# Patient Record
Sex: Male | Born: 1965 | Race: White | Hispanic: No | Smoking: Never smoker
Health system: Southern US, Community
[De-identification: ages and names within clinical notes are randomized; demographics above are authoritative.]

## PROBLEM LIST (undated history)

## (undated) DIAGNOSIS — I10 Essential (primary) hypertension: Secondary | ICD-10-CM

## (undated) DIAGNOSIS — T7840XA Allergy, unspecified, initial encounter: Secondary | ICD-10-CM

## (undated) DIAGNOSIS — K219 Gastro-esophageal reflux disease without esophagitis: Secondary | ICD-10-CM

## (undated) HISTORY — DX: Gastro-esophageal reflux disease without esophagitis: K21.9

## (undated) HISTORY — DX: Essential (primary) hypertension: I10

## (undated) HISTORY — DX: Allergy, unspecified, initial encounter: T78.40XA

---

## 2014-02-18 LAB — BASIC METABOLIC PANEL
BUN: 18 mg/dL (ref 4–21)
Creatinine: 1.3 mg/dL (ref 0.6–1.3)
GLUCOSE: 90 mg/dL
POTASSIUM: 47 mmol/L — AB (ref 3.4–5.3)
Potassium: 4.7 mmol/L (ref 3.4–5.3)
SODIUM: 136 mmol/L — AB (ref 137–147)

## 2014-02-18 LAB — LIPID PANEL
Cholesterol: 182 mg/dL (ref 0–200)
HDL: 72 mg/dL — AB (ref 35–70)
LDL Cholesterol: 101 mg/dL
LDl/HDL Ratio: 1.4
TRIGLYCERIDES: 44 mg/dL (ref 40–160)

## 2014-02-18 LAB — CBC AND DIFFERENTIAL
HCT: 48 % (ref 41–53)
Hemoglobin: 16.5 g/dL (ref 13.5–17.5)
Platelets: 207 10*3/uL (ref 150–399)
WBC: 4.3 10*3/mL

## 2014-02-18 LAB — HEPATIC FUNCTION PANEL
ALT: 18 U/L (ref 10–40)
AST: 26 U/L (ref 14–40)

## 2014-02-18 LAB — TSH: TSH: 1.34 u[IU]/mL (ref 0.41–5.90)

## 2014-10-03 DIAGNOSIS — I861 Scrotal varices: Secondary | ICD-10-CM | POA: Insufficient documentation

## 2014-10-03 DIAGNOSIS — N50819 Testicular pain, unspecified: Secondary | ICD-10-CM | POA: Insufficient documentation

## 2014-10-03 DIAGNOSIS — L309 Dermatitis, unspecified: Secondary | ICD-10-CM | POA: Insufficient documentation

## 2014-12-16 ENCOUNTER — Encounter: Payer: Self-pay | Admitting: Family Medicine

## 2014-12-16 ENCOUNTER — Ambulatory Visit (INDEPENDENT_AMBULATORY_CARE_PROVIDER_SITE_OTHER): Payer: 59 | Admitting: Family Medicine

## 2014-12-16 VITALS — BP 112/70 | HR 68 | Temp 98.7°F | Resp 16 | Ht 70.0 in | Wt 174.0 lb

## 2014-12-16 DIAGNOSIS — Z0000 Encounter for general adult medical examination without abnormal findings: Secondary | ICD-10-CM

## 2014-12-16 DIAGNOSIS — Z131 Encounter for screening for diabetes mellitus: Secondary | ICD-10-CM

## 2014-12-16 DIAGNOSIS — Z1211 Encounter for screening for malignant neoplasm of colon: Secondary | ICD-10-CM

## 2014-12-16 DIAGNOSIS — L309 Dermatitis, unspecified: Secondary | ICD-10-CM

## 2014-12-16 DIAGNOSIS — Z8042 Family history of malignant neoplasm of prostate: Secondary | ICD-10-CM

## 2014-12-16 LAB — IFOBT (OCCULT BLOOD): IMMUNOLOGICAL FECAL OCCULT BLOOD TEST: NEGATIVE

## 2014-12-16 NOTE — Progress Notes (Signed)
Patient ID: Peter Miranda, male   DOB: December 23, 1965, 49 y.o.   MRN: [id number]        Patient: Peter Miranda, Male    DOB: [birth year], 49 y.o.   MRN: [id number] Visit Date: 12/16/2014  Today's Provider: Lorie Phenix, MD   Chief Complaint  Patient presents with  . Annual Exam   Subjective:    Annual physical exam [name] is a 49 y.o. male who presents today for health maintenance and complete physical. He feels well. He reports exercising 4 times a week. He reports he is sleeping fairly well, 6 hours .  01/19/14 CPE 01/19/14 EKG 01/29/14 PSA 0.9   Lab Results  Component Value Date   WBC 4.3 02/18/2014   HGB 16.5 02/18/2014   HCT 48 02/18/2014   PLT 207 02/18/2014   CHOL 182 02/18/2014   TRIG 44 02/18/2014   HDL 72* 02/18/2014   LDLCALC 101 02/18/2014   ALT 18 02/18/2014   AST 26 02/18/2014   NA 136* 02/18/2014   K 47.0* 02/18/2014   CREATININE 1.3 02/18/2014   BUN 18 02/18/2014   TSH 1.34 02/18/2014    -----------------------------------------------------------------   Review of Systems  Constitutional: Negative.   HENT: Negative.   Eyes: Negative.   Respiratory: Negative.   Cardiovascular: Negative.   Gastrointestinal: Negative.   Endocrine: Negative.   Genitourinary: Negative.   Musculoskeletal: Negative.   Skin: Negative.   Allergic/Immunologic: Negative.   Neurological: Negative.   Hematological: Negative.   Psychiatric/Behavioral: Negative.     Social History He  reports that he has never smoked. He has never used smokeless tobacco. He reports that he drinks about 1.8 oz of alcohol per week. He reports that he does not use illicit drugs.  Patient Active Problem List   Diagnosis Date Noted  . Dermatitis, eczematoid 10/03/2014  . Orchialgia 10/03/2014  . Scrotal varicose veins 10/03/2014    History reviewed. No pertinent past surgical history.  Family History  Family Status  Relation Status Death Age  . Mother Alive   . Father Alive     . Paternal Grandfather Deceased 60'S    PROSTATE CANCER    His family history includes Cancer in his father; Heart disease in his father; Hyperlipidemia in his mother; Hypertension in his mother; Macular degeneration in his mother; Varicose Veins in his father.    Not on File  Previous Medications   No medications on file    Patient Care Team: Lorie Phenix, MD as PCP - General (Family Medicine)     Objective:   Vitals: BP 112/70 mmHg  Pulse 68  Temp(Src) 98.7 F (37.1 C) (Oral)  Resp 16  Ht  (1.778 m)  Wt 174 lb (78.926 kg)  BMI 24.97 kg/m2  SpO2 98%   Physical Exam  Constitutional: He is oriented to person, place, and time. He appears well-developed and well-nourished.  HENT:  Head: Normocephalic and atraumatic.  Right Ear: External ear normal.  Left Ear: External ear normal.  Nose: Nose normal.  Mouth/Throat: Oropharynx is clear and moist.  Eyes: Conjunctivae and EOM are normal. Pupils are equal, round, and reactive to light.  Neck: Normal range of motion. Neck supple.  Cardiovascular: Normal rate, regular rhythm, normal heart sounds and intact distal pulses.   Pulmonary/Chest: Effort normal and breath sounds normal.  Abdominal: Soft. Bowel sounds are normal.  Genitourinary: Rectum normal and prostate normal. Guaiac negative stool.  Musculoskeletal: Normal range of motion.  Neurological: He is alert and oriented to  person, place, and time. He has normal reflexes.  Skin: Skin is warm and dry.  Psychiatric: He has a normal mood and affect. His behavior is normal. Judgment and thought content normal.     Depression Screen PHQ 2/9 Scores 12/16/2014  PHQ - 2 Score 0      Assessment & Plan:     Routine Health Maintenance and Physical Exam  Exercise Activities and Dietary recommendations Goals    . Exercise 150 minutes per week (moderate activity)        There is no immunization history on file for this patient.  Health Maintenance  Topic Date  Due  . HIV Screening  05/20/1981  . TETANUS/TDAP  05/20/1985  . INFLUENZA VACCINE  11/23/2014       1. Annual physical exam Stable. Patient advised to continue eating healthy and exercise daily. - CBC with Differential/Platelet - Lipid panel - TSH - Comprehensive metabolic panel  2. Family history of prostate cancer F/U pending lab report. - PSA  3. Dermatitis, eczematoid Treat as needed.   4. Colon cancer screening - IFOBT POC (occult bld, rslt in office)  5. Screening for diabetes mellitus F/U pending lab report. - Hemoglobin A1c      Patient seen and examined by Dr. Leo Grosser, and note scribed by Liz Beach. Dimas, CMA.  I have reviewed the document for accuracy and completeness and I agree with above. Leo Grosser, MD        --------------------------------------------------------------------

## 2015-07-16 ENCOUNTER — Telehealth: Payer: Self-pay | Admitting: Family Medicine

## 2015-07-16 MED ORDER — TRAZODONE HCL 50 MG PO TABS: 25.0000 mg | ORAL_TABLET | Freq: Every evening | ORAL | Status: DC | PRN

## 2015-07-16 NOTE — Telephone Encounter (Signed)
Is this okay? Peter Miranda, CMA  

## 2015-07-16 NOTE — Telephone Encounter (Signed)
Advised pt. Will call back to schedule appointment. Allene DillonEmily Drozdowski, CMA

## 2015-07-16 NOTE — Telephone Encounter (Signed)
Pt states he has been taking his wife's Rx Trazodone 50mg  to help him sleep.  He is not taking this every night but as needed.  Pt is requesting a Rx for this if possible without an office visit.  [employer].  ZO#[phone]/WJCB#[phone]/MW

## 2015-07-16 NOTE — Telephone Encounter (Signed)
Sent in one month supply. Does need ov in next few weeks regarding this issue.   Thanks.

## 2015-08-25 ENCOUNTER — Ambulatory Visit
Admission: RE | Admit: 2015-08-25 | Discharge: 2015-08-25 | Disposition: A | Discharge status: Home / Self Care | Payer: 59 | Source: Ambulatory Visit | Attending: Family Medicine | Admitting: Family Medicine

## 2015-08-25 ENCOUNTER — Encounter: Payer: Self-pay | Admitting: Family Medicine

## 2015-08-25 ENCOUNTER — Ambulatory Visit (INDEPENDENT_AMBULATORY_CARE_PROVIDER_SITE_OTHER): Payer: 59 | Admitting: Family Medicine

## 2015-08-25 VITALS — BP 110/60 | HR 64 | Temp 98.6°F | Resp 16 | Wt 174.0 lb

## 2015-08-25 DIAGNOSIS — G5792 Unspecified mononeuropathy of left lower limb: Secondary | ICD-10-CM | POA: Insufficient documentation

## 2015-08-25 DIAGNOSIS — G5793 Unspecified mononeuropathy of bilateral lower limbs: Secondary | ICD-10-CM | POA: Insufficient documentation

## 2015-08-25 DIAGNOSIS — G5791 Unspecified mononeuropathy of right lower limb: Secondary | ICD-10-CM | POA: Diagnosis present

## 2015-08-25 DIAGNOSIS — G4700 Insomnia, unspecified: Secondary | ICD-10-CM | POA: Insufficient documentation

## 2015-08-25 MED ORDER — TRAZODONE HCL 50 MG PO TABS: 25.0000 mg | ORAL_TABLET | Freq: Every evening | ORAL | Status: DC | PRN

## 2015-08-25 MED ORDER — MELOXICAM 15 MG PO TABS: 15.0000 mg | ORAL_TABLET | Freq: Every day | ORAL | Status: DC

## 2015-08-25 NOTE — Progress Notes (Signed)
Patient ID: Peter Miranda, male   DOB: 1965-09-06, 50 y.o.   MRN: [id number]       Patient: Peter Miranda Male    DOB: 1965-10-09   50 y.o.   MRN: [id number] Visit Date: 08/25/2015  Today's Provider: Lorie Phenix, MD   Chief Complaint  Patient presents with  . Follow-up    insomnia  . Numbness    leg   Subjective:    HPI  Insomnia  He presents today for evaluation of insomnia. Onset was several  years ago. Insomnia is getting improved. He has trouble sleeping once or twice a month.   He does not have difficulty FALLING asleep. He has difficulty STAYING asleep. He does not wake frequently to urinate. He does not have urge to move legs when resting. He is not having pain when trying to sleep  He is not having anxiety. He is having a lot of stress in his life. He is not having depression.  He is not taking stimulant medications. He is not taking new medications:  He is not taking OTC sleeping aid. He is taking medications to help sleep. He is not drinking alcohol to help sleep. He is not using illicit drugs. --------------------------------------------------------------------  Neuropathy: He describes symptoms of numbness. Onset of symptoms was sudden, not related to any specific activity. Symptoms are currently of moderate severity. Symptoms occur all day. The patient denies burning and cramping. Symptoms are symmetric. He denies  nausea, diarrhea and constipation. Previous treatment has included none. Patient reports numbness bilateral leg on the outer side of calf and radiates down to feet.      No Known Allergies Previous Medications   TRAZODONE (DESYREL) 50 MG TABLET    Take 0.5-1 tablets (25-50 mg total) by mouth at bedtime as needed for sleep.    Review of Systems  Constitutional: Negative.   Cardiovascular: Negative.   Neurological: Positive for numbness.  Psychiatric/Behavioral: Positive for sleep disturbance.    Social History  Substance Use Topics    . Smoking status: Never Smoker   . Smokeless tobacco: Never Used  . Alcohol Use: 1.8 oz/week    3 Shots of liquor per week     Comment: occasional   Objective:   BP 110/60 mmHg  Pulse 64  Temp(Src) 98.6 F (37 C) (Oral)  Resp 16  Wt 174 lb (78.926 kg)  SpO2 97%  Physical Exam  Constitutional: He is oriented to person, place, and time. He appears well-developed and well-nourished.  Musculoskeletal: Normal range of motion.  Strength intact.  No foot drop.    Neurological: He is alert and oriented to person, place, and time. He has normal reflexes.  Bilateral decreased sensation and bilateral decreased hair growth  Skin: Skin is warm and dry.  Psychiatric: He has a normal mood and affect. His behavior is normal. Judgment and thought content normal.      Assessment & Plan:     1. Insomnia Controlled on medication. Will write new prescription. Call if worsens or does not improve.   - traZODone (DESYREL) 50 MG tablet; Take 0.5-1 tablets (25-50 mg total) by mouth at bedtime as needed for sleep.  Dispense: 30 tablet; Refill: 5  2. Neuropathic pain, leg, bilateral New problem. Will treat with anti-inflammatory, check Xray and further plan pending these results.   Patient will call if new symptoms develop.   - DG Lumbar Spine Complete; Future - meloxicam (MOBIC) 15 MG tablet; Take 1 tablet (15 mg total) by mouth daily.  Dispense: 30 tablet; Refill: 0     Patient was seen and examined by Leo GrosserNancy J. Keller Mikels, MD, and note scribed, in part,  by Rondel BatonSulibeya Dimas, CMA.   I have reviewed the document for accuracy and completeness and I agree with above. - Leo GrosserNancy J. Giordan Fordham, MD   Lorie PhenixNancy Shiloh Swopes, MD  [facility]

## 2015-08-26 ENCOUNTER — Telehealth: Payer: Self-pay | Admitting: Family Medicine

## 2015-08-26 DIAGNOSIS — R202 Paresthesia of skin: Secondary | ICD-10-CM | POA: Insufficient documentation

## 2015-08-26 NOTE — Telephone Encounter (Signed)
LMTCB. Did speak with Dr. Sherryll BurgerShah.  Xray with some arthritic changes. Will check some labs and refer to neurology.  Thanks.

## 2015-08-26 NOTE — Telephone Encounter (Signed)
Spoke with patient. Will get labs checked and refer to Dr. Sherryll BurgerShah. Thanks.

## 2015-09-01 LAB — VITAMIN D 25 HYDROXY (VIT D DEFICIENCY, FRACTURES): Vit D, 25-Hydroxy: 32.7 ng/mL (ref 30.0–100.0)

## 2015-09-01 LAB — LYME AB/WESTERN BLOT REFLEX
LYME DISEASE AB, QUANT, IGM: <0.8 index (ref 0.00–0.79)
Lyme IgG/IgM Ab: <0.91 {ISR} (ref 0.00–0.90)

## 2015-09-01 LAB — SEDIMENTATION RATE: Sed Rate: 2 mm/hr (ref 0–15)

## 2015-09-01 LAB — TSH: TSH: 0.874 u[IU]/mL (ref 0.450–4.500)

## 2015-09-01 LAB — HEMOGLOBIN A1C
Est. average glucose Bld gHb Est-mCnc: 114 mg/dL
Hgb A1c MFr Bld: 5.6 % (ref 4.8–5.6)

## 2015-09-01 LAB — VITAMIN B12: VITAMIN B 12: 345 pg/mL (ref 211–946)

## 2015-09-02 ENCOUNTER — Telehealth: Payer: Self-pay

## 2015-09-02 NOTE — Telephone Encounter (Signed)
-----   Message from Lorie PhenixNancy Maloney, MD sent at 09/02/2015  2:09 PM EDT ----- Labs normal. Please notify patient.  Keep appointment  with neurology. Thanks.

## 2015-09-02 NOTE — Telephone Encounter (Signed)
LMTCB 09/02/2015  Thanks,   -Angelus Hoopes  

## 2015-09-03 NOTE — Telephone Encounter (Signed)
Pt advised-aa 

## 2015-09-03 NOTE — Telephone Encounter (Signed)
Pt is returning call.  ZO#[phone]/WJCB#[phone]/MW

## 2017-07-10 ENCOUNTER — Encounter: Payer: Self-pay | Admitting: Family Medicine

## 2017-07-10 ENCOUNTER — Ambulatory Visit (INDEPENDENT_AMBULATORY_CARE_PROVIDER_SITE_OTHER): Payer: 59 | Admitting: Family Medicine

## 2017-07-10 VITALS — BP 120/80 | HR 67 | Temp 98.6°F | Resp 16 | Ht 70.0 in | Wt 184.0 lb

## 2017-07-10 DIAGNOSIS — Z23 Encounter for immunization: Secondary | ICD-10-CM

## 2017-07-10 DIAGNOSIS — Z1211 Encounter for screening for malignant neoplasm of colon: Secondary | ICD-10-CM | POA: Diagnosis not present

## 2017-07-10 DIAGNOSIS — R351 Nocturia: Secondary | ICD-10-CM

## 2017-07-10 DIAGNOSIS — G4700 Insomnia, unspecified: Secondary | ICD-10-CM

## 2017-07-10 DIAGNOSIS — Z0000 Encounter for general adult medical examination without abnormal findings: Secondary | ICD-10-CM | POA: Diagnosis not present

## 2017-07-10 DIAGNOSIS — N401 Enlarged prostate with lower urinary tract symptoms: Secondary | ICD-10-CM

## 2017-07-10 DIAGNOSIS — Z85828 Personal history of other malignant neoplasm of skin: Secondary | ICD-10-CM | POA: Insufficient documentation

## 2017-07-10 NOTE — Progress Notes (Signed)
Patient: Peter Miranda, Male    DOB: 13-Apr-1966, 52 y.o.   MRN: [id number] Visit Date: 07/10/2017  Today's Provider: Mila Merryonald Marlys Stegmaier, MD   Chief Complaint  Patient presents with  . Annual Exam  . Insomnia   Subjective:    Annual physical exam Peter Miranda is a 52 y.o. male who presents today for health maintenance and complete physical. He feels fairly well. He reports exercising 3 days a week. He reports he is sleeping fairly well.  ----------------------------------------------------------------- Insomnia From 08/25/2015-seen by Dr. Elease HashimotoMaloney. No changes. Controlled current medication. Today reports that this problem is stable. He states he rarely takes Trazodone. He averages getting 6 hours of sleep each night.   Review of Systems  Constitutional: Negative for appetite change, chills, fatigue and fever.  HENT: Negative for congestion, ear pain, hearing loss, nosebleeds and trouble swallowing.   Eyes: Negative for pain and visual disturbance.  Respiratory: Negative for cough, chest tightness and shortness of breath.   Cardiovascular: Negative for chest pain, palpitations and leg swelling.  Gastrointestinal: Negative for abdominal pain, blood in stool, constipation, diarrhea, nausea and vomiting.  Endocrine: Negative for polydipsia, polyphagia and polyuria.  Genitourinary: Positive for difficulty urinating. Negative for dysuria and flank pain.  Musculoskeletal: Positive for arthralgias. Negative for back pain, joint swelling, myalgias and neck stiffness.  Skin: Negative for color change, rash and wound.  Neurological: Negative for dizziness, tremors, seizures, speech difficulty, weakness, light-headedness and headaches.  Psychiatric/Behavioral: Negative for behavioral problems, confusion, decreased concentration, dysphoric mood and sleep disturbance. The patient is not nervous/anxious.   All other systems reviewed and are negative.   Social History      He  reports that   has never smoked. he has never used smokeless tobacco. He reports that he drinks about 1.8 oz of alcohol per week. He reports that he does not use drugs.       Social History   Socioeconomic History  . Marital status: Legally Separated    Spouse name: None  . Number of children: 2  . Years of education: None  . Highest education level: None  Social Needs  . Financial resource strain: None  . Food insecurity - worry: None  . Food insecurity - inability: None  . Transportation needs - medical: None  . Transportation needs - non-medical: None  Occupational History  . Occupation: Agricultural engineerApplication consultant  Tobacco Use  . Smoking status: Never Smoker  . Smokeless tobacco: Never Used  Substance and Sexual Activity  . Alcohol use: Yes    Alcohol/week: 1.8 oz    Types: 3 Shots of liquor per week    Comment: occasional  . Drug use: No  . Sexual activity: None  Other Topics Concern  . None  Social History Narrative  . None    No past medical history on file.   Patient Active Problem List   Diagnosis Date Noted  . Paresthesia of both legs 08/26/2015  . Insomnia 08/25/2015  . Neuropathic pain, leg, bilateral 08/25/2015  . Family history of prostate cancer 12/16/2014  . Dermatitis, eczematoid 10/03/2014  . Orchialgia 10/03/2014  . Scrotal varicose veins 10/03/2014    No past surgical history on file.  Family History        Family Status  Relation Name Status  . Mother  Alive  . Father  Alive  . PGF  Deceased at age 51'S       PROSTATE CANCER  His family history includes Cancer in his father; Heart disease in his father; Hyperlipidemia in his mother; Hypertension in his mother; Macular degeneration in his mother; Varicose Veins in his father.      No Known Allergies   Current Outpatient Medications:  .  traZODone (DESYREL) 50 MG tablet, Take 0.5-1 tablets (25-50 mg total) by mouth at bedtime as needed for sleep., Disp: 30 tablet, Rfl: 5   Patient Care  Team: Malva Limes, MD as PCP - General (Family Medicine) Madlyn Frankel, MD as Referring Physician (Dermatology)      Objective:   Vitals: BP 120/80 (BP Location: Left Arm, Patient Position: Sitting, Cuff Size: Large)   Pulse 67   Temp 98.6 F (37 C) (Oral)   Resp 16   Ht 5\' 10"  (1.778 m)   Wt 184 lb (83.5 kg)   SpO2 98% Comment: room air  BMI 26.40 kg/m   There were no vitals filed for this visit.   Physical Exam   General Appearance:    Alert, cooperative, no distress, appears stated age  Head:    Normocephalic, without obvious abnormality, atraumatic  Eyes:    PERRL, conjunctiva/corneas clear, EOM's intact, fundi    benign, both eyes       Ears:    Normal TM's and external ear canals, both ears  Nose:   Nares normal, septum midline, mucosa normal, no drainage   or sinus tenderness  Throat:   Lips, mucosa, and tongue normal; teeth and gums normal  Neck:   Supple, symmetrical, trachea midline, no adenopathy;       thyroid:  No enlargement/tenderness/nodules; no carotid   bruit or JVD  Back:     Symmetric, no curvature, ROM normal, no CVA tenderness  Lungs:     Clear to auscultation bilaterally, respirations unlabored  Chest wall:    No tenderness or deformity  Heart:    Regular rate and rhythm, S1 and S2 normal, no murmur, rub   or gallop  Abdomen:     Soft, non-tender, bowel sounds active all four quadrants,    no masses, no organomegaly  Genitalia:    deferred  Rectal:    normal tone, normal prostate, no masses or tenderness and the prostate is enlarged at the bilateral, with an approx volume of 30 gms,  Extremities:   Extremities normal, atraumatic, no cyanosis or edema  Pulses:   2+ and symmetric all extremities  Skin:   Skin color, texture, turgor normal, no rashes or lesions  Lymph nodes:   Cervical, supraclavicular, and axillary nodes normal  Neurologic:   CNII-XII intact. Normal strength, sensation and reflexes      throughout    Depression  Screen PHQ 2/9 Scores 07/10/2017 12/16/2014  PHQ - 2 Score 0 0  PHQ- 9 Score 2 -      Assessment & Plan:     Routine Health Maintenance and Physical Exam  Exercise Activities and Dietary recommendations Goals    . Exercise 150 minutes per week (moderate activity)       Immunization History  Administered Date(s) Administered  . Tdap 01/13/2008    Health Maintenance  Topic Date Due  . HIV Screening  05/20/1981  . COLONOSCOPY  05/20/2016  . INFLUENZA VACCINE  11/22/2016  . TETANUS/TDAP  01/12/2018     Discussed health benefits of physical activity, and encouraged him to engage in regular exercise appropriate for his age and condition.    --------------------------------------------------------------------  1. Annual physical exam Unremarkable  exam. Counseled regarding prudent diet and regular exercise.   - Lipid panel - PSA - Comprehensive metabolic panel - Hemoglobin A1c  2. Screening for colon cancer  - Ambulatory referral to Gastroenterology  3. Insomnia, unspecified type Doing well rarely requiring trazodone.   4. Need for prophylactic vaccination using tetanus and diphtheria toxoids adsorbed (Td) vaccine  - Td : Tetanus/diphtheria >7yo Preservative  free  5. BPH associated with nocturia No disruptive enough to require medications at this point.     Mila Merry, MD  [facility]

## 2017-07-10 NOTE — Patient Instructions (Addendum)
The CDC recommends two doses of Shingrix (the shingles vaccine) separated by 2 to 6 months for adults age 51 years and older. I recommend checking with your insurance plan regarding coverage for this vaccine.    Please contact your eyecare professional to schedule a routine eye exam    Preventive Care 40-64 Years, Male Preventive care refers to lifestyle choices and visits with your health care provider that can promote health and wellness. What does preventive care include?  A yearly physical exam. This is also called an annual well check.  Dental exams once or twice a year.  Routine eye exams. Ask your health care provider how often you should have your eyes checked.  Personal lifestyle choices, including: ? Daily care of your teeth and gums. ? Regular physical activity. ? Eating a healthy diet. ? Avoiding tobacco and drug use. ? Limiting alcohol use. ? Practicing safe sex. ? Taking low-dose aspirin every day starting at age 46. What happens during an annual well check? The services and screenings done by your health care provider during your annual well check will depend on your age, overall health, lifestyle risk factors, and family history of disease. Counseling Your health care provider may ask you questions about your:  Alcohol use.  Tobacco use.  Drug use.  Emotional well-being.  Home and relationship well-being.  Sexual activity.  Eating habits.  Work and work Statistician.  Screening You may have the following tests or measurements:  Height, weight, and BMI.  Blood pressure.  Lipid and cholesterol levels. These may be checked every 5 years, or more frequently if you are over 49 years old.  Skin check.  Lung cancer screening. You may have this screening every year starting at age 52 if you have a 30-pack-year history of smoking and currently smoke or have quit within the past 15 years.  Fecal occult blood test (FOBT) of the stool. You may have this  test every year starting at age 34.  Flexible sigmoidoscopy or colonoscopy. You may have a sigmoidoscopy every 5 years or a colonoscopy every 10 years starting at age 67.  Prostate cancer screening. Recommendations will vary depending on your family history and other risks.  Hepatitis C blood test.  Hepatitis B blood test.  Sexually transmitted disease (STD) testing.  Diabetes screening. This is done by checking your blood sugar (glucose) after you have not eaten for a while (fasting). You may have this done every 1-3 years.  Discuss your test results, treatment options, and if necessary, the need for more tests with your health care provider. Vaccines Your health care provider may recommend certain vaccines, such as:  Influenza vaccine. This is recommended every year.  Tetanus, diphtheria, and acellular pertussis (Tdap, Td) vaccine. You may need a Td booster every 10 years.  Varicella vaccine. You may need this if you have not been vaccinated.  Zoster vaccine. You may need this after age 3.  Measles, mumps, and rubella (MMR) vaccine. You may need at least one dose of MMR if you were born in 1957 or later. You may also need a second dose.  Pneumococcal 13-valent conjugate (PCV13) vaccine. You may need this if you have certain conditions and have not been vaccinated.  Pneumococcal polysaccharide (PPSV23) vaccine. You may need one or two doses if you smoke cigarettes or if you have certain conditions.  Meningococcal vaccine. You may need this if you have certain conditions.  Hepatitis A vaccine. You may need this if you have certain  conditions or if you travel or work in places where you may be exposed to hepatitis A.  Hepatitis B vaccine. You may need this if you have certain conditions or if you travel or work in places where you may be exposed to hepatitis B.  Haemophilus influenzae type b (Hib) vaccine. You may need this if you have certain risk factors.  Talk to your  health care provider about which screenings and vaccines you need and how often you need them. This information is not intended to replace advice given to you by your health care provider. Make sure you discuss any questions you have with your health care provider. Document Released: 05/07/2015 Document Revised: 12/29/2015 Document Reviewed: 02/09/2015 Elsevier Interactive Patient Education  [employer].

## 2017-07-20 ENCOUNTER — Telehealth: Payer: Self-pay

## 2017-07-20 ENCOUNTER — Other Ambulatory Visit: Payer: Self-pay

## 2017-07-20 DIAGNOSIS — Z1211 Encounter for screening for malignant neoplasm of colon: Secondary | ICD-10-CM

## 2017-07-20 NOTE — Telephone Encounter (Signed)
Gastroenterology Pre-Procedure Review  Request Date:  Requesting Physician: Dr.   PATIENT REVIEW QUESTIONS: The patient responded to the following health history questions as indicated:    1. Are you having any GI issues? no 2. Do you have a personal history of Polyps? no 3. Do you have a family history of Colon Cancer or Polyps? yes (both parents polyps) 4. Diabetes Mellitus? no 5. Joint replacements in the past 12 months?no 6. Major health problems in the past 3 months?no 7. Any artificial heart valves, MVP, or defibrillator?no    MEDICATIONS & ALLERGIES:    Patient reports the following regarding taking any anticoagulation/antiplatelet therapy:   Plavix, Coumadin, Eliquis, Xarelto, Lovenox, Pradaxa, Brilinta, or Effient? no Aspirin? no  Patient confirms/reports the following medications:  Current Outpatient Medications  Medication Sig Dispense Refill  . traZODone (DESYREL) 50 MG tablet Take 0.5-1 tablets (25-50 mg total) by mouth at bedtime as needed for sleep. 30 tablet 5   No current facility-administered medications for this visit.     Patient confirms/reports the following allergies:  No Known Allergies  No orders of the defined types were placed in this encounter.   AUTHORIZATION INFORMATION Primary Insurance: 1D#: Group #:  Secondary Insurance: 1D#: Group #:  SCHEDULE INFORMATION: Date: 08/27/17 Time: Location: [facility]

## 2017-08-23 ENCOUNTER — Telehealth: Payer: Self-pay | Admitting: Gastroenterology

## 2017-08-23 NOTE — Telephone Encounter (Signed)
Pt left vm to reschedule his colonoscopy scheduled for 05/6 please call pt

## 2017-08-24 ENCOUNTER — Other Ambulatory Visit: Payer: Self-pay

## 2017-08-24 DIAGNOSIS — Z1211 Encounter for screening for malignant neoplasm of colon: Secondary | ICD-10-CM

## 2017-08-27 ENCOUNTER — Encounter: Admission: RE | Payer: Self-pay | Source: Ambulatory Visit

## 2017-08-27 ENCOUNTER — Ambulatory Visit: Admission: RE | Admit: 2017-08-27 | Payer: 59 | Source: Ambulatory Visit | Admitting: Gastroenterology

## 2017-08-27 SURGERY — COLONOSCOPY WITH PROPOFOL
Anesthesia: General

## 2017-09-24 ENCOUNTER — Ambulatory Visit
Admission: RE | Admit: 2017-09-24 | Discharge: 2017-09-24 | Disposition: A | Discharge status: Home / Self Care | Payer: 59 | Source: Ambulatory Visit | Attending: Gastroenterology | Admitting: Gastroenterology

## 2017-09-24 ENCOUNTER — Ambulatory Visit: Payer: 59 | Admitting: Anesthesiology

## 2017-09-24 ENCOUNTER — Encounter
Admission: RE | Disposition: A | Discharge status: Home / Self Care | Payer: Self-pay | Source: Ambulatory Visit | Attending: Gastroenterology

## 2017-09-24 DIAGNOSIS — K648 Other hemorrhoids: Secondary | ICD-10-CM | POA: Diagnosis not present

## 2017-09-24 DIAGNOSIS — Z79899 Other long term (current) drug therapy: Secondary | ICD-10-CM | POA: Insufficient documentation

## 2017-09-24 DIAGNOSIS — Z1211 Encounter for screening for malignant neoplasm of colon: Secondary | ICD-10-CM | POA: Diagnosis not present

## 2017-09-24 HISTORY — PX: COLONOSCOPY WITH PROPOFOL: SHX5780

## 2017-09-24 SURGERY — COLONOSCOPY WITH PROPOFOL
Anesthesia: General

## 2017-09-24 MED ORDER — SODIUM CHLORIDE 0.9 % IV SOLN
INTRAVENOUS | Status: DC
  Administered 2017-09-24: 1000 mL via INTRAVENOUS

## 2017-09-24 MED ORDER — MIDAZOLAM HCL 2 MG/2ML IJ SOLN
INTRAMUSCULAR | Status: AC
  Filled 2017-09-24: qty 2

## 2017-09-24 MED ORDER — PROPOFOL 10 MG/ML IV BOLUS
INTRAVENOUS | Status: DC | PRN
  Administered 2017-09-24: 80 mg via INTRAVENOUS

## 2017-09-24 MED ORDER — PROPOFOL 500 MG/50ML IV EMUL
INTRAVENOUS | Status: DC | PRN
  Administered 2017-09-24: 120 ug/kg/min via INTRAVENOUS

## 2017-09-24 MED ORDER — PHENYLEPHRINE HCL 10 MG/ML IJ SOLN
INTRAMUSCULAR | Status: AC
  Filled 2017-09-24: qty 1

## 2017-09-24 MED ORDER — PROPOFOL 500 MG/50ML IV EMUL
INTRAVENOUS | Status: AC
  Filled 2017-09-24: qty 50

## 2017-09-24 MED ORDER — MIDAZOLAM HCL 2 MG/2ML IJ SOLN
INTRAMUSCULAR | Status: DC | PRN
  Administered 2017-09-24: 2 mg via INTRAVENOUS

## 2017-09-24 NOTE — Anesthesia Postprocedure Evaluation (Signed)
Anesthesia Post Note  Patient: Peter BlueGlenn Howard Stanke Jr.  Procedure(s) Performed: COLONOSCOPY WITH PROPOFOL (N/A )  Anesthesia Type: General     Last Vitals:  Vitals:   09/24/17 0854 09/24/17 0904  BP: 115/79 117/82  Pulse: 73 68  Resp: 11 14  Temp:    SpO2: 99% 99%    Last Pain:  Vitals:   09/24/17 0904  TempSrc:   PainSc: 0-No pain                 Yevette EdwardsJames G Sommer Spickard

## 2017-09-24 NOTE — H&P (Signed)
Peter Repressohini R Neila Teem, MD [address]  [address]  [place], [place] [address]  Main: [phone]  Fax: [phone] Pager: [phone]  Primary Care Physician:  Malva LimesFisher, Peter E, MD Primary Gastroenterologist:  Dr. Arlyss Repressohini R Brailee Riede  Pre-Procedure History & Physical: HPI:  Peter BlueGlenn Howard Kaeding Jr. is a 52 y.o. male is here for an colonoscopy.   No past medical history on file.  No past surgical history on file.  Prior to Admission medications   Medication Sig Start Date End Date Taking? Authorizing Provider  traZODone (DESYREL) 50 MG tablet Take 0.5-1 tablets (25-50 mg total) by mouth at bedtime as needed for sleep. 08/25/15  Yes Lorie PhenixMaloney, Nancy, MD    Allergies as of 08/24/2017  . (No Known Allergies)    Family History  Problem Relation Age of Onset  . Hyperlipidemia Mother   . Hypertension Mother   . Macular degeneration Mother   . Cancer Father        PROSTATE  . Varicose Veins Father     Social History   Socioeconomic History  . Marital status: Legally Separated    Spouse name: Not on file  . Number of children: 2  . Years of education: Not on file  . Highest education level: Not on file  Occupational History  . Occupation: Agricultural engineerApplication consultant  Social Needs  . Financial resource strain: Not on file  . Food insecurity:    Worry: Not on file    Inability: Not on file  . Transportation needs:    Medical: Not on file    Non-medical: Not on file  Tobacco Use  . Smoking status: Never Smoker  . Smokeless tobacco: Never Used  Substance and Sexual Activity  . Alcohol use: Yes    Alcohol/week: 1.8 oz    Types: 3 Shots of liquor per week    Comment: occasional  . Drug use: No  . Sexual activity: Not on file  Lifestyle  . Physical activity:    Days per week: Not on file    Minutes per session: Not on file  . Stress: Not on file  Relationships  . Social connections:    Talks on phone: Not on file    Gets together: Not on file    Attends religious service:  Not on file    Active member of club or organization: Not on file    Attends meetings of clubs or organizations: Not on file    Relationship status: Not on file  . Intimate partner violence:    Fear of current or ex partner: Not on file    Emotionally abused: Not on file    Physically abused: Not on file    Forced sexual activity: Not on file  Other Topics Concern  . Not on file  Social History Narrative  . Not on file    Review of Systems: See HPI, otherwise negative ROS  Physical Exam: BP (!) 137/95   Pulse 66   Temp (!) 96.8 F (36 C)   Resp 16   Ht 5\' 10"  (1.778 m)   Wt 180 lb (81.6 kg)   SpO2 100%   BMI 25.83 kg/m  General:   Alert,  pleasant and cooperative in NAD Head:  Normocephalic and atraumatic. Neck:  Supple; no masses or thyromegaly. Lungs:  Clear throughout to auscultation.    Heart:  Regular rate and rhythm. Abdomen:  Soft, nontender and nondistended. Normal bowel sounds, without guarding, and without rebound.   Neurologic:  Alert  and  oriented x4;  grossly normal neurologically.  Impression/Plan: Peter Blue. is here for an colonoscopy to be performed for colon cancer screening  Risks, benefits, limitations, and alternatives regarding  colonoscopy have been reviewed with the patient.  Questions have been answered.  All parties agreeable.   Lannette Donath, MD  09/24/2017, 8:12 AM

## 2017-09-24 NOTE — Op Note (Signed)
[facility] Patient Name: Peter Miranda Procedure Date: 09/24/2017 6:56 AM MRN: [id number] Account #: [id number] Date of Birth: 1966-02-15 Admit Type: Outpatient Age: 52 Room: [facility] ENDO ROOM 2 Gender: Male Note Status: Finalized Procedure:            Colonoscopy Indications:          Screening for colorectal malignant neoplasm, This is                        the patient's first colonoscopy Providers:            Lin Landsman MD, MD Referring MD:         Kirstie Peri. Caryn Section, MD (Referring MD) Medicines:            Monitored Anesthesia Care Complications:        No immediate complications. Estimated blood loss: None. Procedure:            Pre-Anesthesia Assessment:                       - Prior to the procedure, a History and Physical was                        performed, and patient medications and allergies were                        reviewed. The patient is competent. The risks and                        benefits of the procedure and the sedation options and                        risks were discussed with the patient. All questions                        were answered and informed consent was obtained.                        Patient identification and proposed procedure were                        verified by the physician, the nurse, the                        anesthesiologist, the anesthetist and the technician in                        the pre-procedure area in the procedure room in the                        endoscopy suite. Mental Status Examination: alert and                        oriented. Airway Examination: normal oropharyngeal                        airway and neck mobility. Respiratory Examination:                        clear to auscultation. CV Examination: normal.  Prophylactic Antibiotics: The patient does not require                        prophylactic antibiotics. Prior Anticoagulants: The                         patient has taken no previous anticoagulant or                        antiplatelet agents. ASA Grade Assessment: II - A                        patient with mild systemic disease. After reviewing the                        risks and benefits, the patient was deemed in                        satisfactory condition to undergo the procedure. The                        anesthesia plan was to use monitored anesthesia care                        (MAC). Immediately prior to administration of                        medications, the patient was re-assessed for adequacy                        to receive sedatives. The heart rate, respiratory rate,                        oxygen saturations, blood pressure, adequacy of                        pulmonary ventilation, and response to care were                        monitored throughout the procedure. The physical status                        of the patient was re-assessed after the procedure.                       After obtaining informed consent, the colonoscope was                        passed under direct vision. Throughout the procedure,                        the patient's blood pressure, pulse, and oxygen                        saturations were monitored continuously. The                        Colonoscope was introduced through the anus and  advanced to the the terminal ileum. The colonoscopy was                        performed without difficulty. The patient tolerated the                        procedure well. The quality of the bowel preparation                        was evaluated using the BBPS [facility] Bowel Preparation                        Scale) with scores of: Right Colon = 3, Transverse                        Colon = 3 and Left Colon = 3 (entire mucosa seen well                        with no residual staining, small fragments of stool or                        opaque liquid). The total BBPS score equals  9. Findings:      The perianal and digital rectal examinations were normal. Pertinent       negatives include normal sphincter tone and no palpable rectal lesions.      The terminal ileum appeared normal.      Non-bleeding internal hemorrhoids were found during retroflexion. The       hemorrhoids were medium-sized.      The entire examined colon appeared normal. Impression:           - The examined portion of the ileum was normal.                       - Non-bleeding internal hemorrhoids.                       - The entire examined colon is normal.                       - No specimens collected. Recommendation:       - Discharge patient to home (with spouse).                       - Resume previous diet today.                       - Continue present medications.                       - Repeat colonoscopy in 10 years for surveillance. Procedure Code(s):    --- Professional ---                       Z6109, Colorectal cancer screening; colonoscopy on                        individual not meeting criteria for high risk Diagnosis Code(s):    --- Professional ---  Z12.11, Encounter for screening for malignant neoplasm                        of colon                       K64.8, Other hemorrhoids CPT copyright 2017 American Medical Association. All rights reserved. The codes documented in this report are preliminary and upon coder review may  be revised to meet current compliance requirements. Dr. Ulyess Mort Lin Landsman MD, MD 09/24/2017 8:32:53 AM This report has been signed electronically. Number of Addenda: 0 Note Initiated On: 09/24/2017 6:56 AM Scope Withdrawal Time: 0 hours 9 minutes 46 seconds  Total Procedure Duration: 0 hours 12 minutes 44 seconds       [facility]

## 2017-09-24 NOTE — Transfer of Care (Signed)
Immediate Anesthesia Transfer of Care Note  Patient: Peter BlueGlenn Howard Racine Jr.  Procedure(s) Performed: COLONOSCOPY WITH PROPOFOL (N/A )  Patient Location: Endoscopy Unit  Anesthesia Type:General  Level of Consciousness: drowsy and patient cooperative  Airway & Oxygen Therapy: Patient Spontanous Breathing and Patient connected to nasal cannula oxygen  Post-op Assessment: Report given to RN and Post -op Vital signs reviewed and stable  Post vital signs: Reviewed and stable  Last Vitals:  Vitals Value Taken Time  BP 96/61 09/24/2017  8:35 AM  Temp 35.7 C 09/24/2017  8:34 AM  Pulse 69 09/24/2017  8:38 AM  Resp 12 09/24/2017  8:38 AM  SpO2 100 % 09/24/2017  8:38 AM  Vitals shown include unvalidated device data.  Last Pain:  Vitals:   09/24/17 0834  TempSrc: Tympanic  PainSc: 0-No pain         Complications: No apparent anesthesia complications

## 2017-09-24 NOTE — Anesthesia Preprocedure Evaluation (Signed)
Anesthesia Evaluation  Patient identified by MRN, date of birth, ID band Patient awake    Reviewed: Allergy & Precautions, H&P , NPO status , Patient's Chart, lab work & pertinent test results, reviewed documented beta blocker date and time   Airway Mallampati: II   Neck ROM: full    Dental  (+) Teeth Intact   Pulmonary neg pulmonary ROS,    Pulmonary exam normal        Cardiovascular Exercise Tolerance: Good negative cardio ROS Normal cardiovascular exam Rhythm:regular Rate:Normal     Neuro/Psych negative neurological ROS  negative psych ROS   GI/Hepatic negative GI ROS, Neg liver ROS,   Endo/Other  negative endocrine ROS  Renal/GU negative Renal ROS  negative genitourinary   Musculoskeletal   Abdominal   Peds  Hematology negative hematology ROS (+)   Anesthesia Other Findings No past medical history on file. No past surgical history on file. BMI    Body Mass Index:  25.83 kg/m     Reproductive/Obstetrics negative OB ROS                             Anesthesia Physical Anesthesia Plan  ASA: II  Anesthesia Plan: General   Post-op Pain Management:    Induction:   PONV Risk Score and Plan:   Airway Management Planned:   Additional Equipment:   Intra-op Plan:   Post-operative Plan:   Informed Consent: I have reviewed the patients History and Physical, chart, labs and discussed the procedure including the risks, benefits and alternatives for the proposed anesthesia with the patient or authorized representative who has indicated his/her understanding and acceptance.   Dental Advisory Given  Plan Discussed with: CRNA  Anesthesia Plan Comments:         Anesthesia Quick Evaluation

## 2017-09-24 NOTE — Anesthesia Post-op Follow-up Note (Signed)
Anesthesia QCDR form completed.        

## 2017-09-27 ENCOUNTER — Encounter: Payer: Self-pay | Admitting: Gastroenterology

## 2017-11-20 LAB — LIPID PANEL
Chol/HDL Ratio: 2.4 ratio (ref 0.0–5.0)
Cholesterol, Total: 162 mg/dL (ref 100–199)
HDL: 68 mg/dL (ref >39)
LDL Calculated: 83 mg/dL (ref 0–99)
Triglycerides: 54 mg/dL (ref 0–149)
VLDL Cholesterol Cal: 11 mg/dL (ref 5–40)

## 2017-11-20 LAB — COMPREHENSIVE METABOLIC PANEL WITH GFR
ALT: 23 IU/L (ref 0–44)
AST: 26 IU/L (ref 0–40)
Albumin/Globulin Ratio: 1.9 (ref 1.2–2.2)
Albumin: 4.5 g/dL (ref 3.5–5.5)
Alkaline Phosphatase: 80 IU/L (ref 39–117)
BUN/Creatinine Ratio: 18 (ref 9–20)
BUN: 22 mg/dL (ref 6–24)
Bilirubin Total: 0.9 mg/dL (ref 0.0–1.2)
CO2: 24 mmol/L (ref 20–29)
Calcium: 9.2 mg/dL (ref 8.7–10.2)
Chloride: 103 mmol/L (ref 96–106)
Creatinine, Ser: 1.2 mg/dL (ref 0.76–1.27)
GFR calc Af Amer: 80 mL/min/1.73 (ref >59)
GFR calc non Af Amer: 70 mL/min/1.73 (ref >59)
Globulin, Total: 2.4 g/dL (ref 1.5–4.5)
Glucose: 97 mg/dL (ref 65–99)
Potassium: 4.2 mmol/L (ref 3.5–5.2)
Sodium: 143 mmol/L (ref 134–144)
Total Protein: 6.9 g/dL (ref 6.0–8.5)

## 2017-11-20 LAB — HEMOGLOBIN A1C
Est. average glucose Bld gHb Est-mCnc: 103 mg/dL
Hgb A1c MFr Bld: 5.2 % (ref 4.8–5.6)

## 2017-11-20 LAB — PSA: Prostate Specific Ag, Serum: 1 ng/mL (ref 0.0–4.0)

## 2018-07-09 DIAGNOSIS — L570 Actinic keratosis: Secondary | ICD-10-CM | POA: Diagnosis not present

## 2018-07-09 DIAGNOSIS — D1801 Hemangioma of skin and subcutaneous tissue: Secondary | ICD-10-CM | POA: Diagnosis not present

## 2018-07-09 DIAGNOSIS — L821 Other seborrheic keratosis: Secondary | ICD-10-CM | POA: Diagnosis not present

## 2018-07-09 DIAGNOSIS — L812 Freckles: Secondary | ICD-10-CM | POA: Diagnosis not present

## 2018-07-09 DIAGNOSIS — Z85828 Personal history of other malignant neoplasm of skin: Secondary | ICD-10-CM | POA: Diagnosis not present

## 2019-03-17 DIAGNOSIS — Z1283 Encounter for screening for malignant neoplasm of skin: Secondary | ICD-10-CM | POA: Diagnosis not present

## 2019-03-17 DIAGNOSIS — D1801 Hemangioma of skin and subcutaneous tissue: Secondary | ICD-10-CM | POA: Diagnosis not present

## 2019-03-17 DIAGNOSIS — Z808 Family history of malignant neoplasm of other organs or systems: Secondary | ICD-10-CM | POA: Diagnosis not present

## 2019-03-17 DIAGNOSIS — Z85828 Personal history of other malignant neoplasm of skin: Secondary | ICD-10-CM | POA: Diagnosis not present

## 2019-06-20 ENCOUNTER — Ambulatory Visit: Payer: Self-pay | Admitting: *Deleted

## 2019-06-20 ENCOUNTER — Emergency Department: Payer: BC Managed Care – PPO

## 2019-06-20 ENCOUNTER — Other Ambulatory Visit: Payer: Self-pay

## 2019-06-20 ENCOUNTER — Emergency Department
Admission: EM | Admit: 2019-06-20 | Discharge: 2019-06-20 | Disposition: A | Discharge status: Home / Self Care | Payer: BC Managed Care – PPO | Attending: Emergency Medicine | Admitting: Emergency Medicine

## 2019-06-20 DIAGNOSIS — R0789 Other chest pain: Secondary | ICD-10-CM | POA: Diagnosis not present

## 2019-06-20 DIAGNOSIS — R079 Chest pain, unspecified: Secondary | ICD-10-CM | POA: Diagnosis not present

## 2019-06-20 LAB — CBC
HCT: 47.4 % (ref 39.0–52.0)
Hemoglobin: 16.7 g/dL (ref 13.0–17.0)
MCH: 31.8 pg (ref 26.0–34.0)
MCHC: 35.2 g/dL (ref 30.0–36.0)
MCV: 90.3 fL (ref 80.0–100.0)
Platelets: 215 10*3/uL (ref 150–400)
RBC: 5.25 MIL/uL (ref 4.22–5.81)
RDW: 12.2 % (ref 11.5–15.5)
WBC: 5.9 10*3/uL (ref 4.0–10.5)
nRBC: 0 % (ref 0.0–0.2)

## 2019-06-20 LAB — BASIC METABOLIC PANEL
Anion gap: 8 (ref 5–15)
BUN: 22 mg/dL — ABNORMAL HIGH (ref 6–20)
CO2: 25 mmol/L (ref 22–32)
Calcium: 9 mg/dL (ref 8.9–10.3)
Chloride: 104 mmol/L (ref 98–111)
Creatinine, Ser: 1.2 mg/dL (ref 0.61–1.24)
GFR calc Af Amer: >60 mL/min (ref >60)
GFR calc non Af Amer: >60 mL/min (ref >60)
Glucose, Bld: 110 mg/dL — ABNORMAL HIGH (ref 70–99)
Potassium: 3.9 mmol/L (ref 3.5–5.1)
Sodium: 137 mmol/L (ref 135–145)

## 2019-06-20 LAB — TROPONIN I (HIGH SENSITIVITY)
Troponin I (High Sensitivity): 3 ng/L (ref <18)
Troponin I (High Sensitivity): 3 ng/L (ref <18)

## 2019-06-20 NOTE — ED Triage Notes (Signed)
Pt reports more frequent "sensations of pressure and tightness" in his chest  And points to the upper portion of his chest. Pt denies feeling like its a pain, states that he thought maybe it was reflux but denies hx of ever having reflux and states that the sensation is occurring more frequently and is more noticable

## 2019-06-20 NOTE — Discharge Instructions (Addendum)
Your EKG, Chest xray, and lab tests today were all normal. Please follow up with your doctor next week for further evaluation of your symptoms.  Try taking famotidine (Pepcid) two times a day in the meantime to see if this resolves the chest pain.

## 2019-06-20 NOTE — ED Provider Notes (Signed)
[facility] Emergency Department Provider Note  ____________________________________________  Time seen: Approximately 4:25 PM  I have reviewed the triage vital signs and the nursing notes.   HISTORY  Chief Complaint Chest Pain    HPI Peter Miranda. is a 54 y.o. male with no significant past medical history who comes ED complaining of intermittent chest discomfort over the past few weeks.  Isolated episodes, occurring at rest.  No aggravating or alleviating factors, not affected by eating.  No shortness of breath diaphoresis or vomiting or radiation.  Not exertional, not pleuritic.  Recently able to do his usual elliptical exercise without any unusual symptoms.  After climbing 4 flights of stairs with his laptop backpack to go to work, does not feel any chest pain or excessive windedness.      No past medical history on file.   Patient Active Problem List   Diagnosis Date Noted  . Colon cancer screening   . History of SCC (squamous cell carcinoma) of skin 07/10/2017  . BPH associated with nocturia 07/10/2017  . Paresthesia of both legs 08/26/2015  . Insomnia 08/25/2015  . Neuropathic pain, leg, bilateral 08/25/2015  . Family history of prostate cancer 12/16/2014  . Dermatitis, eczematoid 10/03/2014  . Orchialgia 10/03/2014  . Scrotal varicose veins 10/03/2014     Past Surgical History:  Procedure Laterality Date  . COLONOSCOPY WITH PROPOFOL N/A 09/24/2017   Procedure: COLONOSCOPY WITH PROPOFOL;  Surgeon: Toney Reil, MD;  Location: [facility] ENDOSCOPY;  Service: Gastroenterology;  Laterality: N/A;     Prior to Admission medications   Medication Sig Start Date End Date Taking? Authorizing Provider  traZODone (DESYREL) 50 MG tablet Take 0.5-1 tablets (25-50 mg total) by mouth at bedtime as needed for sleep. 08/25/15   Lorie Phenix, MD     Allergies Patient has no known allergies.   Family History  Problem Relation Age of Onset   . Hyperlipidemia Mother   . Hypertension Mother   . Macular degeneration Mother   . Cancer Father        PROSTATE  . Varicose Veins Father     Social History Social History   Tobacco Use  . Smoking status: Never Smoker  . Smokeless tobacco: Never Used  Substance Use Topics  . Alcohol use: Yes    Alcohol/week: 3.0 standard drinks    Types: 3 Shots of liquor per week    Comment: occasional  . Drug use: No    Review of Systems  Constitutional:   No fever or chills.  ENT:   No sore throat. No rhinorrhea. Cardiovascular:   Positive as above chest pain without syncope. Respiratory:   No dyspnea or cough. Gastrointestinal:   Negative for abdominal pain, vomiting and diarrhea.  Musculoskeletal:   Negative for focal pain or swelling All other systems reviewed and are negative except as documented above in ROS and HPI.  ____________________________________________   PHYSICAL EXAM:  VITAL SIGNS: ED Triage Vitals  Enc Vitals Group     BP 06/20/19 1228 139/89     Pulse Rate 06/20/19 1228 76     Resp 06/20/19 1228 16     Temp 06/20/19 1228 98.7 F (37.1 C)     Temp Source 06/20/19 1228 Oral     SpO2 06/20/19 1228 100 %     Weight 06/20/19 1229 180 lb (81.6 kg)     Height 06/20/19 1229 5\' 10"  (1.778 m)     Head Circumference --  Peak Flow --      Pain Score 06/20/19 1228 1     Pain Loc --      Pain Edu? --      Excl. in GC? --     Vital signs reviewed, nursing assessments reviewed.   Constitutional:   Alert and oriented. Non-toxic appearance. Eyes:   Conjunctivae are normal. EOMI. PERRL. ENT      Head:   Normocephalic and atraumatic.      Nose:   Wearing a mask.      Mouth/Throat:   Wearing a mask.      Neck:   No meningismus. Full ROM. Hematological/Lymphatic/Immunilogical:   No cervical lymphadenopathy. Cardiovascular:   RRR. Symmetric bilateral radial and DP pulses.  No murmurs. Cap refill less than 2 seconds. Respiratory:   Normal respiratory effort  without tachypnea/retractions. Breath sounds are clear and equal bilaterally. No wheezes/rales/rhonchi. Gastrointestinal:   Soft and nontender. Non distended. There is no CVA tenderness.  No rebound, rigidity, or guarding.  Musculoskeletal:   Normal range of motion in all extremities. No joint effusions.  No lower extremity tenderness.  No edema.  Chest wall nontender Neurologic:   Normal speech and language.  Motor grossly intact. No acute focal neurologic deficits are appreciated.  Skin:    Skin is warm, dry and intact. No rash noted.  No petechiae, purpura, or bullae.  ____________________________________________    LABS (pertinent positives/negatives) (all labs ordered are listed, but only abnormal results are displayed) Labs Reviewed  BASIC METABOLIC PANEL - Abnormal; Notable for the following components:      Result Value   Glucose, Bld 110 (*)    BUN 22 (*)    All other components within normal limits  CBC  TROPONIN I (HIGH SENSITIVITY)  TROPONIN I (HIGH SENSITIVITY)   ____________________________________________   EKG  Interpreted by me  Date: 06/20/2019  Rate: 84  Rhythm: normal sinus rhythm  QRS Axis: normal  Intervals: normal  ST/T Wave abnormalities: normal  Conduction Disutrbances: none  Narrative Interpretation: unremarkable      ____________________________________________    RADIOLOGY  DG Chest 2 View  Result Date: 06/20/2019 CLINICAL DATA:  Chest pain EXAM: CHEST - 2 VIEW COMPARISON:  None. FINDINGS: The heart size and mediastinal contours are within normal limits. Both lungs are clear. The visualized skeletal structures are unremarkable. IMPRESSION: No acute abnormality of the lungs. Electronically Signed   By: Lauralyn Primes M.D.   On: 06/20/2019 14:07    ____________________________________________   PROCEDURES Procedures  ____________________________________________  DIFFERENTIAL DIAGNOSIS   Non-STEMI, GERD, anxiety,  bronchospasm  CLINICAL IMPRESSION / ASSESSMENT AND PLAN / ED COURSE  Medications ordered in the ED: Medications - No data to display  Pertinent labs & imaging results that were available during my care of the patient were reviewed by me and considered in my medical decision making (see chart for details).  Peter Miranda. was evaluated in Emergency Department on 06/20/2019 for the symptoms described in the history of present illness. He was evaluated in the context of the global COVID-19 pandemic, which necessitated consideration that the patient might be at risk for infection with the SARS-CoV-2 virus that causes COVID-19. Institutional protocols and algorithms that pertain to the evaluation of patients at risk for COVID-19 are in a state of rapid change based on information released by regulatory bodies including the CDC and federal and state organizations. These policies and algorithms were followed during the patient's care in the ED.  Patient presents with atypical chest pain, Considering the patient's symptoms, medical history, and physical examination today, I have low suspicion for ACS, PE, TAD, pneumothorax, carditis, mediastinitis, pneumonia, CHF, or sepsis.  Vital signs EKG and exam are all normal.  Chest x-ray unremarkable, labs including serial troponins are normal.  Most likely acid reflux symptoms.  Stable for discharge home, trial of Pepcid to follow-up with primary care.      ____________________________________________   FINAL CLINICAL IMPRESSION(S) / ED DIAGNOSES    Final diagnoses:  Atypical chest pain     ED Discharge Orders    None      Portions of this note were generated with dragon dictation software. Dictation errors may occur despite best attempts at proofreading.   Carrie Mew, MD 06/20/19 [id number]

## 2019-06-20 NOTE — Telephone Encounter (Addendum)
Pt called because he is having "sensations in his chest"; they started 2-3 weeks ago and they have become more frequent; the pt says it in the "center toward his breast bone"; he says it is not isolated to exercise, or taking a deep breath; he rates his discomfort is 1-3; his sensations are becoming more frequent; the pt says he has not had heart burn before, but he does eat fast food; recommendations made per nurse triage protocol; he verbalized understanding but would like to be seen in the office; he is seen by Dr Sherrie Mustache, [facility]; spoke with Michelle Nasuti and she concurs the pt should be evaluated a in the ED; pt notified, and verbalized understanding; will route to office for notification.  Reason for Disposition . [1] Chest pain (or "angina") comes and goes AND [2] is happening more often (increasing in frequency) or getting worse (increasing in severity)  Answer Assessment - Initial Assessment Questions 1. LOCATION: "Where does it hurt?"       Center of chest near breast bone 2. RADIATION: "Does the pain go anywhere else?" (e.g., into neck, jaw, arms, back)     Once up toward his throat where neck meets collar bone 3. ONSET: "When did the chest pain begin?" (Minutes, hours or days)      05/30/19 4. PATTERN "Does the pain come and go, or has it been constant since it started?"  "Does it get worse with exertion?"     Intermittent; not necesarrily 5. DURATION: "How long does it last" (e.g., seconds, minutes, hours)    hours 6. SEVERITY: "How bad is the pain?"  (e.g., Scale 1-10; mild, moderate, or severe)    - MILD (1-3): doesn't interfere with normal activities     - MODERATE (4-7): interferes with normal activities or awakens from sleep    - SEVERE (8-10): excruciating pain, unable to do any normal activities       1-3 out of 10 7. CARDIAC RISK FACTORS: "Do you have any history of heart problems or risk factors for heart disease?" (e.g., angina, prior heart attack; diabetes, high blood  pressure, high cholesterol, smoker, or strong family history of heart disease)     Mother has history of palpitations, grand mother has highBP 8. PULMONARY RISK FACTORS: "Do you have any history of lung disease?"  (e.g., blood clots in lung, asthma, emphysema, birth control pills)     not sure 9. CAUSE: "What do you think is causing the chest pain?"     not sure 10. OTHER SYMPTOMS: "Do you have any other symptoms?" (e.g., dizziness, nausea, vomiting, sweating, fever, difficulty breathing, cough)     no 11. PREGNANCY: "Is there any chance you are pregnant?" "When was your last menstrual period?"     n/a  Protocols used: CHEST PAIN-A-AH

## 2020-01-06 NOTE — Progress Notes (Signed)
Complete physical exam   Patient: Peter Miranda.   DOB: Aug 31, 1965   54 y.o. Male  MRN: [id number] Visit Date: 01/07/2020  Today's healthcare provider: Trey Sailors, PA-C   Chief Complaint  Patient presents with  . Annual Exam   Subjective    Peter Miranda [place]. is a 54 y.o. male who presents today for a complete physical exam.  He reports consuming a general diet. Exercising some. He generally feels well. He reports sleeping well. He does not have additional problems to discuss today.  HPI   Family history of prostate cancer in father and grandfather.   No past medical history on file. Past Surgical History:  Procedure Laterality Date  . COLONOSCOPY WITH PROPOFOL N/A 09/24/2017   Procedure: COLONOSCOPY WITH PROPOFOL;  Surgeon: Toney Reil, MD;  Location: [facility] ENDOSCOPY;  Service: Gastroenterology;  Laterality: N/A;   Social History   Socioeconomic History  . Marital status: Legally Separated    Spouse name: Not on file  . Number of children: 2  . Years of education: Not on file  . Highest education level: Not on file  Occupational History  . Occupation: Agricultural engineer  Tobacco Use  . Smoking status: Never Smoker  . Smokeless tobacco: Never Used  Vaping Use  . Vaping Use: Never used  Substance and Sexual Activity  . Alcohol use: Yes    Alcohol/week: 3.0 standard drinks    Types: 3 Shots of liquor per week    Comment: occasional  . Drug use: No  . Sexual activity: Not on file  Other Topics Concern  . Not on file  Social History Narrative  . Not on file   Social Determinants of Health   Financial Resource Strain:   . Difficulty of Paying Living Expenses: Not on file  Food Insecurity:   . Worried About Programme researcher, broadcasting/film/video in the Last Year: Not on file  . Ran Out of Food in the Last Year: Not on file  Transportation Needs:   . Lack of Transportation (Medical): Not on file  . Lack of Transportation (Non-Medical): Not on  file  Physical Activity:   . Days of Exercise per Week: Not on file  . Minutes of Exercise per Session: Not on file  Stress:   . Feeling of Stress : Not on file  Social Connections:   . Frequency of Communication with Friends and Family: Not on file  . Frequency of Social Gatherings with Friends and Family: Not on file  . Attends Religious Services: Not on file  . Active Member of Clubs or Organizations: Not on file  . Attends Banker Meetings: Not on file  . Marital Status: Not on file  Intimate Partner Violence:   . Fear of Current or Ex-Partner: Not on file  . Emotionally Abused: Not on file  . Physically Abused: Not on file  . Sexually Abused: Not on file   Family Status  Relation Name Status  . Mother  Alive  . Father  Alive  . PGF  Deceased at age 50'S       PROSTATE CANCER  . Sister  Alive  . Brother  Alive  . MGM  Deceased  . Oneal Grout  (Not Specified)  . Neg Hx  (Not Specified)   Family History  Problem Relation Age of Onset  . Hyperlipidemia Mother   . Hypertension Mother   . Varicose Veins Father   . Prostate cancer Father   .  Prostate cancer Paternal Grandfather   . Macular degeneration Maternal Grandmother   . Prostate cancer Paternal Uncle   . Colon cancer Neg Hx    No Known Allergies  Patient Care Team: Malva Limes, MD as PCP - General (Family Medicine) Madlyn Frankel, MD as Referring Physician (Dermatology)   Medications: Outpatient Medications Prior to Visit  Medication Sig  . [DISCONTINUED] traZODone (DESYREL) 50 MG tablet Take 0.5-1 tablets (25-50 mg total) by mouth at bedtime as needed for sleep.   No facility-administered medications prior to visit.    Review of Systems  Constitutional: Negative.   HENT: Negative.   Eyes: Negative.   Respiratory: Negative.   Cardiovascular: Negative.   Gastrointestinal: Negative.   Endocrine: Negative.   Genitourinary: Negative.   Musculoskeletal: Negative.   Skin:  Negative.   Allergic/Immunologic: Negative.   Neurological: Negative.   Hematological: Negative.   Psychiatric/Behavioral: Negative.       Objective    BP 122/87 (BP Location: Right Arm, Patient Position: Sitting, Cuff Size: Large)   Pulse 85   Temp 98.5 F (36.9 C) (Oral)   Ht 5\' 10"  (1.778 m)   Wt 182 lb (82.6 kg)   SpO2 100%   BMI 26.11 kg/m    Physical Exam Constitutional:      Appearance: Normal appearance.  Cardiovascular:     Rate and Rhythm: Normal rate and regular rhythm.     Heart sounds: Normal heart sounds.  Pulmonary:     Effort: Pulmonary effort is normal.     Breath sounds: Normal breath sounds.  Abdominal:     General: Bowel sounds are normal.     Palpations: Abdomen is soft.  Skin:    General: Skin is warm and dry.  Neurological:     Mental Status: He is alert and oriented to person, place, and time. Mental status is at baseline.  Psychiatric:        Mood and Affect: Mood normal.        Behavior: Behavior normal.       Last depression screening scores PHQ 2/9 Scores 01/07/2020 07/10/2017 12/16/2014  PHQ - 2 Score 0 0 0  PHQ- 9 Score 0 2 -   Last fall risk screening Fall Risk  01/07/2020  Falls in the past year? 0  Number falls in past yr: 0  Injury with Fall? 0  Follow up Falls evaluation completed   Last Audit-C alcohol use screening Alcohol Use Disorder Test (AUDIT) 01/07/2020  1. How often do you have a drink containing alcohol? 3  2. How many drinks containing alcohol do you have on a typical day when you are drinking? 0  3. How often do you have six or more drinks on one occasion? 0  AUDIT-C Score 3   A score of 3 or more in women, and 4 or more in men indicates increased risk for alcohol abuse, EXCEPT if all of the points are from question 1   Results for orders placed or performed in visit on 01/07/20  HIV Antibody (routine testing w rflx)  Result Value Ref Range   HIV Screen 4th Generation wRfx Non Reactive Non Reactive  TSH   Result Value Ref Range   TSH 1.230 0.450 - 4.500 uIU/mL  Lipid panel  Result Value Ref Range   Cholesterol, Total 182 100 - 199 mg/dL   Triglycerides 01/09/20 0 - 149 mg/dL   HDL 57 384 mg/dL   VLDL Cholesterol Cal 25 5 - 40 mg/dL  LDL Chol Calc (NIH) 100 (H) 0 - 99 mg/dL   Chol/HDL Ratio 3.2 0.0 - 5.0 ratio  Comprehensive metabolic panel  Result Value Ref Range   Glucose 102 (H) 65 - 99 mg/dL   BUN 19 6 - 24 mg/dL   Creatinine, Ser 6.14 0.76 - 1.27 mg/dL   GFR calc non Af Amer 85 >59 mL/min/1.73   GFR calc Af Amer 98 >59 mL/min/1.73   BUN/Creatinine Ratio 19 9 - 20   Sodium 139 134 - 144 mmol/L   Potassium 3.9 3.5 - 5.2 mmol/L   Chloride 102 96 - 106 mmol/L   CO2 21 20 - 29 mmol/L   Calcium 9.0 8.7 - 10.2 mg/dL   Total Protein 6.7 6.0 - 8.5 g/dL   Albumin 4.5 3.8 - 4.9 g/dL   Globulin, Total 2.2 1.5 - 4.5 g/dL   Albumin/Globulin Ratio 2.0 1.2 - 2.2   Bilirubin Total 0.4 0.0 - 1.2 mg/dL   Alkaline Phosphatase 79 44 - 121 IU/L   AST 24 0 - 40 IU/L   ALT 29 0 - 44 IU/L  CBC with Differential/Platelet  Result Value Ref Range   WBC 5.4 3.4 - 10.8 x10E3/uL   RBC 5.31 4.14 - 5.80 x10E6/uL   Hemoglobin 16.9 13.0 - 17.7 g/dL   Hematocrit 43.1 54.0 - 51.0 %   MCV 94 79 - 97 fL   MCH 31.8 26.6 - 33.0 pg   MCHC 34.0 31 - 35 g/dL   RDW 08.6 76.1 - 95.0 %   Platelets 240 150 - 450 x10E3/uL   Neutrophils 54 Not Estab. %   Lymphs 32 Not Estab. %   Monocytes 11 Not Estab. %   Eos 2 Not Estab. %   Basos 1 Not Estab. %   Neutrophils Absolute 2.9 1 - 7 x10E3/uL   Lymphocytes Absolute 1.7 0 - 3 x10E3/uL   Monocytes Absolute 0.6 0 - 0 x10E3/uL   EOS (ABSOLUTE) 0.1 0.0 - 0.4 x10E3/uL   Basophils Absolute 0.1 0 - 0 x10E3/uL   Immature Granulocytes 0 Not Estab. %   Immature Grans (Abs) 0.0 0.0 - 0.1 x10E3/uL  PSA  Result Value Ref Range   Prostate Specific Ag, Serum 1.0 0.0 - 4.0 ng/mL  Hepatitis C Antibody  Result Value Ref Range   Hep C Virus Ab <0.1 0.0 - 0.9 s/co ratio     Assessment & Plan    Routine Health Maintenance and Physical Exam  Exercise Activities and Dietary recommendations Goals    . Exercise 150 minutes per week (moderate activity)       Immunization History  Administered Date(s) Administered  . Influenza,inj,Quad PF,6+ Mos 01/07/2020  . PFIZER SARS-COV-2 Vaccination 07/29/2019, 08/19/2019  . Td 07/10/2017  . Tdap 01/13/2008    Health Maintenance  Topic Date Due  . Janet Berlin  07/11/2027  . COLONOSCOPY  09/25/2027  . INFLUENZA VACCINE  Completed  . COVID-19 Vaccine  Completed  . Hepatitis C Screening  Completed  . HIV Screening  Completed    Discussed health benefits of physical activity, and encouraged him to engage in regular exercise appropriate for his age and condition. 1. Annual physical exam  - HIV Antibody (routine testing w rflx) - TSH - Lipid panel - Comprehensive metabolic panel - CBC with Differential/Platelet - PSA  2. Need for influenza vaccination  - Flu Vaccine QUAD 6+ mos PF IM (Fluarix Quad PF)  3. Encounter for hepatitis C screening test for low risk patient  -  Hepatitis C Antibody  4. Insomnia  - traZODone (DESYREL) 50 MG tablet; Take 0.5-1 tablets (25-50 mg total) by mouth at bedtime as needed for sleep.  Dispense: 30 tablet; Refill: 5  5. Family history of prostate cancer  Will refer for monitoring with Dr. Evelene CroonWolff, whom he has seen in the past.   - Ambulatory referral to Urology   No follow-ups on file.     ITrey Sailors, Shaleka Brines M Braedan Meuth, PA-C, have reviewed all documentation for this visit. The documentation on 01/08/20 for the exam, diagnosis, procedures, and orders are all accurate and complete.  The entirety of the information documented in the History of Present Illness, Review of Systems and Physical Exam were personally obtained by me. Portions of this information were initially documented by [facility] and reviewed by me for thoroughness and accuracy.     Maryella ShiversAdriana M Tyna Huertas, PA-C   [facility] [phone] (phone) [phone] (fax)  [facility]

## 2020-01-07 ENCOUNTER — Ambulatory Visit (INDEPENDENT_AMBULATORY_CARE_PROVIDER_SITE_OTHER): Payer: BC Managed Care – PPO | Admitting: Physician Assistant

## 2020-01-07 ENCOUNTER — Encounter: Payer: Self-pay | Admitting: Physician Assistant

## 2020-01-07 ENCOUNTER — Other Ambulatory Visit: Payer: Self-pay

## 2020-01-07 VITALS — BP 122/87 | HR 85 | Temp 98.5°F | Ht 70.0 in | Wt 182.0 lb

## 2020-01-07 DIAGNOSIS — Z0000 Encounter for general adult medical examination without abnormal findings: Secondary | ICD-10-CM

## 2020-01-07 DIAGNOSIS — Z23 Encounter for immunization: Secondary | ICD-10-CM

## 2020-01-07 DIAGNOSIS — Z1159 Encounter for screening for other viral diseases: Secondary | ICD-10-CM

## 2020-01-07 DIAGNOSIS — Z8042 Family history of malignant neoplasm of prostate: Secondary | ICD-10-CM

## 2020-01-07 DIAGNOSIS — G4700 Insomnia, unspecified: Secondary | ICD-10-CM | POA: Diagnosis not present

## 2020-01-07 MED ORDER — TRAZODONE HCL 50 MG PO TABS: 25.0000 mg | ORAL_TABLET | Freq: Every evening | ORAL | 5 refills | Status: DC | PRN

## 2020-01-07 NOTE — Patient Instructions (Signed)

## 2020-01-08 LAB — COMPREHENSIVE METABOLIC PANEL
ALT: 29 IU/L (ref 0–44)
AST: 24 IU/L (ref 0–40)
Albumin/Globulin Ratio: 2 (ref 1.2–2.2)
Albumin: 4.5 g/dL (ref 3.8–4.9)
Alkaline Phosphatase: 79 IU/L (ref 44–121)
BUN/Creatinine Ratio: 19 (ref 9–20)
BUN: 19 mg/dL (ref 6–24)
Bilirubin Total: 0.4 mg/dL (ref 0.0–1.2)
CO2: 21 mmol/L (ref 20–29)
Calcium: 9 mg/dL (ref 8.7–10.2)
Chloride: 102 mmol/L (ref 96–106)
Creatinine, Ser: 1.01 mg/dL (ref 0.76–1.27)
GFR calc Af Amer: 98 mL/min/{1.73_m2} (ref >59)
GFR calc non Af Amer: 85 mL/min/{1.73_m2} (ref >59)
Globulin, Total: 2.2 g/dL (ref 1.5–4.5)
Glucose: 102 mg/dL — ABNORMAL HIGH (ref 65–99)
Potassium: 3.9 mmol/L (ref 3.5–5.2)
Sodium: 139 mmol/L (ref 134–144)
Total Protein: 6.7 g/dL (ref 6.0–8.5)

## 2020-01-08 LAB — LIPID PANEL
Chol/HDL Ratio: 3.2 ratio (ref 0.0–5.0)
Cholesterol, Total: 182 mg/dL (ref 100–199)
HDL: 57 mg/dL (ref >39)
LDL Chol Calc (NIH): 100 mg/dL — ABNORMAL HIGH (ref 0–99)
Triglycerides: 145 mg/dL (ref 0–149)
VLDL Cholesterol Cal: 25 mg/dL (ref 5–40)

## 2020-01-08 LAB — CBC WITH DIFFERENTIAL/PLATELET
Basophils Absolute: 0.1 10*3/uL (ref 0.0–0.2)
Basos: 1 %
EOS (ABSOLUTE): 0.1 10*3/uL (ref 0.0–0.4)
Eos: 2 %
Hematocrit: 49.7 % (ref 37.5–51.0)
Hemoglobin: 16.9 g/dL (ref 13.0–17.7)
Immature Grans (Abs): 0 10*3/uL (ref 0.0–0.1)
Immature Granulocytes: 0 %
Lymphocytes Absolute: 1.7 10*3/uL (ref 0.7–3.1)
Lymphs: 32 %
MCH: 31.8 pg (ref 26.6–33.0)
MCHC: 34 g/dL (ref 31.5–35.7)
MCV: 94 fL (ref 79–97)
Monocytes Absolute: 0.6 10*3/uL (ref 0.1–0.9)
Monocytes: 11 %
Neutrophils Absolute: 2.9 10*3/uL (ref 1.4–7.0)
Neutrophils: 54 %
Platelets: 240 10*3/uL (ref 150–450)
RBC: 5.31 x10E6/uL (ref 4.14–5.80)
RDW: 12.8 % (ref 11.6–15.4)
WBC: 5.4 10*3/uL (ref 3.4–10.8)

## 2020-01-08 LAB — TSH: TSH: 1.23 u[IU]/mL (ref 0.450–4.500)

## 2020-01-08 LAB — HEPATITIS C ANTIBODY: Hep C Virus Ab: <0.1 s/co ratio (ref 0.0–0.9)

## 2020-01-08 LAB — PSA: Prostate Specific Ag, Serum: 1 ng/mL (ref 0.0–4.0)

## 2020-01-08 LAB — HIV ANTIBODY (ROUTINE TESTING W REFLEX): HIV Screen 4th Generation wRfx: NONREACTIVE

## 2020-02-04 DIAGNOSIS — Z125 Encounter for screening for malignant neoplasm of prostate: Secondary | ICD-10-CM | POA: Diagnosis not present

## 2020-02-04 DIAGNOSIS — N401 Enlarged prostate with lower urinary tract symptoms: Secondary | ICD-10-CM | POA: Diagnosis not present

## 2020-03-26 ENCOUNTER — Encounter: Payer: 59 | Admitting: Family Medicine

## 2020-05-14 ENCOUNTER — Ambulatory Visit (INDEPENDENT_AMBULATORY_CARE_PROVIDER_SITE_OTHER): Payer: 59 | Admitting: Family Medicine

## 2020-05-14 ENCOUNTER — Encounter: Payer: Self-pay | Admitting: Family Medicine

## 2020-05-14 DIAGNOSIS — H6981 Other specified disorders of Eustachian tube, right ear: Secondary | ICD-10-CM | POA: Diagnosis not present

## 2020-05-14 NOTE — Progress Notes (Signed)
Virtual telephone visit    Virtual Visit via Telephone Note   This visit type was conducted due to national recommendations for restrictions regarding the COVID-19 Pandemic (e.g. social distancing) in an effort to limit this patient's exposure and mitigate transmission in our community. Due to his co-morbid illnesses, this patient is at least at moderate risk for complications without adequate follow up. This format is felt to be most appropriate for this patient at this time. The patient did not have access to video technology or had technical difficulties with video requiring transitioning to audio format only (telephone). Physical exam was limited to content and character of the telephone converstion.    Patient location: Home Provider location: Office  I discussed the limitations of evaluation and management by telemedicine and the availability of in person appointments. The patient expressed understanding and agreed to proceed.   Visit Date: 05/14/2020  Today's healthcare provider: Dortha Kern, PA-C   Chief Complaint  Patient presents with  . Ear Pain   Subjective    HPI  Otalgia  There is pain in the right ear. This is a new problem. Episode onset: 1 week ago. The problem occurs constantly. The problem has been unchanged. There has been no fever. Pertinent negatives include no abdominal pain, coughing, diarrhea, ear discharge, headaches, hearing loss, neck pain, rash, rhinorrhea, sore throat or vomiting. Treatments tried: hydrogen peroxide. The treatment provided no relief.    Patient Active Problem List   Diagnosis Date Noted  . Colon cancer screening   . History of SCC (squamous cell carcinoma) of skin 07/10/2017  . BPH associated with nocturia 07/10/2017  . Paresthesia of both legs 08/26/2015  . Insomnia 08/25/2015  . Neuropathic pain, leg, bilateral 08/25/2015  . Family history of prostate cancer 12/16/2014  . Dermatitis, eczematoid 10/03/2014  . Orchialgia  10/03/2014  . Scrotal varicose veins 10/03/2014    No past medical history on file. Past Surgical History:  Procedure Laterality Date  . COLONOSCOPY WITH PROPOFOL N/A 09/24/2017   Procedure: COLONOSCOPY WITH PROPOFOL;  Surgeon: Toney Reil, MD;  Location: [facility];  Service: Gastroenterology;  Laterality: N/A;   Social History   Socioeconomic History  . Marital status: Legally Separated    Spouse name: Not on file  . Number of children: 2  . Years of education: Not on file  . Highest education level: Not on file  Occupational History  . Occupation: Agricultural engineer  Tobacco Use  . Smoking status: Never Smoker  . Smokeless tobacco: Never Used  Vaping Use  . Vaping Use: Never used  Substance and Sexual Activity  . Alcohol use: Yes    Alcohol/week: 3.0 standard drinks    Types: 3 Shots of liquor per week    Comment: occasional  . Drug use: No  . Sexual activity: Not on file  Other Topics Concern  . Not on file  Social History Narrative  . Not on file   Social Determinants of Health   Financial Resource Strain: Not on file  Food Insecurity: Not on file  Transportation Needs: Not on file  Physical Activity: Not on file  Stress: Not on file  Social Connections: Not on file  Intimate Partner Violence: Not on file   Family History  Problem Relation Age of Onset  . Hyperlipidemia Mother   . Hypertension Mother   . Varicose Veins Father   . Prostate cancer Father   . Prostate cancer Paternal Grandfather   . Macular degeneration Maternal Grandmother   .  Prostate cancer Paternal Uncle   . Colon cancer Neg Hx    No Known Allergies    Medications: Outpatient Medications Prior to Visit  Medication Sig  . traZODone (DESYREL) 50 MG tablet Take 0.5-1 tablets (25-50 mg total) by mouth at bedtime as needed for sleep.   No facility-administered medications prior to visit.    Review of Systems  Constitutional: Negative for appetite change, chills and  fever.  HENT: Positive for ear pain (left ear).   Respiratory: Negative for chest tightness, shortness of breath and wheezing.   Cardiovascular: Negative for chest pain and palpitations.  Gastrointestinal: Negative for abdominal pain, nausea and vomiting.      Objective    There were no vitals taken for this visit.  During telephonic interview, no apparent respiratory distress. Describes some pressure in the right ear. No drainage.   Assessment & Plan     1. Dysfunction of right eustachian tube Developed pressure in the right ear over a week ago after a trip in an airplane to go skiing 04-25-20 to 05-01-20. Denies any URI or COVID symptoms. No fever. May use antihistamine if any PND, Afrin BID for 3 days and Flonase HS. Recheck prn.   No follow-ups on file.    I discussed the assessment and treatment plan with the patient. The patient was provided an opportunity to ask questions and all were answered. The patient agreed with the plan and demonstrated an understanding of the instructions.   The patient was advised to call back or seek an in-person evaluation if the symptoms worsen or if the condition fails to improve as anticipated.  I provided 19 minutes of non-face-to-face time during this encounter.  I, Suhaib Guzzo, PA-C, have reviewed all documentation for this visit. The documentation on 05/14/20 for the exam, diagnosis, procedures, and orders are all accurate and complete.   Dortha Kern, PA-C [employer] [phone] (phone) [phone] (fax)  [facility]

## 2020-05-24 ENCOUNTER — Encounter: Payer: Self-pay | Admitting: Physician Assistant

## 2020-05-24 ENCOUNTER — Ambulatory Visit (INDEPENDENT_AMBULATORY_CARE_PROVIDER_SITE_OTHER): Payer: 59 | Admitting: Physician Assistant

## 2020-05-24 ENCOUNTER — Other Ambulatory Visit: Payer: Self-pay

## 2020-05-24 VITALS — BP 122/88 | HR 78 | Temp 98.0°F | Resp 16 | Wt 186.0 lb

## 2020-05-24 DIAGNOSIS — H6981 Other specified disorders of Eustachian tube, right ear: Secondary | ICD-10-CM | POA: Diagnosis not present

## 2020-05-24 MED ORDER — PREDNISONE 10 MG (21) PO TBPK: ORAL_TABLET | ORAL | 0 refills | Status: DC

## 2020-05-24 NOTE — Progress Notes (Signed)
Established patient visit   Patient: Peter Miranda.   DOB: 01-Feb-1966   55 y.o. Male  MRN: [id number] Visit Date: 05/24/2020  Today's healthcare provider: Margaretann Loveless, PA-C   Chief Complaint  Patient presents with  . Ear Fullness   Subjective    HPI  Patient here today C/O right ear pain/fullness x 2-3 weeks. Patient reports using Flonase and Afrin as advised last week by Maurine Minister. He only used for 3 days each. Denies dizziness or hearing loss.  Patient Active Problem List   Diagnosis Date Noted  . Colon cancer screening   . History of SCC (squamous cell carcinoma) of skin 07/10/2017  . BPH associated with nocturia 07/10/2017  . Paresthesia of both legs 08/26/2015  . Insomnia 08/25/2015  . Neuropathic pain, leg, bilateral 08/25/2015  . Family history of prostate cancer 12/16/2014  . Dermatitis, eczematoid 10/03/2014  . Orchialgia 10/03/2014  . Scrotal varicose veins 10/03/2014   Social History   Tobacco Use  . Smoking status: Never Smoker  . Smokeless tobacco: Never Used  Vaping Use  . Vaping Use: Never used  Substance Use Topics  . Alcohol use: Yes    Alcohol/week: 3.0 standard drinks    Types: 3 Shots of liquor per week    Comment: occasional  . Drug use: No   No Known Allergies     Medications: Outpatient Medications Prior to Visit  Medication Sig  . fluticasone (FLONASE) 50 MCG/ACT nasal spray Place 1 spray into both nostrils daily.  . traZODone (DESYREL) 50 MG tablet Take 0.5-1 tablets (25-50 mg total) by mouth at bedtime as needed for sleep.   No facility-administered medications prior to visit.    Review of Systems  Constitutional: Negative for appetite change, chills and fever.  HENT: Positive for ear pain. Negative for congestion, ear discharge and rhinorrhea.   Respiratory: Negative for cough and shortness of breath.   Cardiovascular: Negative for chest pain and palpitations.  Psychiatric/Behavioral: Negative.      @AMBLABREVIEWLINK @  Objective    BP 122/88 (BP Location: Right Arm, Patient Position: Sitting, Cuff Size: Normal)   Pulse 78   Temp 98 F (36.7 C) (Oral)   Resp 16   Wt 186 lb (84.4 kg)   SpO2 98%   BMI 26.69 kg/m  BP Readings from Last 3 Encounters:  05/24/20 122/88  01/07/20 122/87  06/20/19 (!) 146/89   Wt Readings from Last 3 Encounters:  05/24/20 186 lb (84.4 kg)  01/07/20 182 lb (82.6 kg)  06/20/19 180 lb (81.6 kg)      Physical Exam Vitals reviewed.  Constitutional:      General: He is not in acute distress.    Appearance: Normal appearance. He is well-developed and well-nourished. He is not ill-appearing or diaphoretic.  HENT:     Head: Normocephalic and atraumatic.     Right Ear: Hearing, ear canal and external ear normal. Swelling present. A middle ear effusion (clear) is present. Tympanic membrane is bulging. Tympanic membrane is not erythematous.     Left Ear: Hearing, ear canal and external ear normal. A middle ear effusion (clear) is present. Tympanic membrane is not erythematous or bulging.     Nose: Mucosal edema present.     Right Sinus: No maxillary sinus tenderness or frontal sinus tenderness.     Left Sinus: No maxillary sinus tenderness or frontal sinus tenderness.     Mouth/Throat:     Mouth: Oropharynx is clear and moist and  mucous membranes are normal.     Pharynx: Uvula midline. No posterior oropharyngeal edema.  Eyes:     General:        Right eye: No discharge.        Left eye: No discharge.     Extraocular Movements: Extraocular movements intact and EOM normal.     Conjunctiva/sclera: Conjunctivae normal.     Pupils: Pupils are equal, round, and reactive to light.  Neck:     Thyroid: No thyromegaly.     Trachea: No tracheal deviation.     Meningeal: Brudzinski's sign and Kernig's sign absent.  Cardiovascular:     Rate and Rhythm: Normal rate and regular rhythm.     Heart sounds: Normal heart sounds. No murmur heard. No friction  rub. No gallop.   Pulmonary:     Effort: Pulmonary effort is normal. No respiratory distress.     Breath sounds: Normal breath sounds. No stridor. No wheezing or rales.  Musculoskeletal:     Cervical back: Normal range of motion and neck supple. No tenderness.  Lymphadenopathy:     Cervical: No cervical adenopathy.  Skin:    General: Skin is warm and dry.  Neurological:     Mental Status: He is alert.       No results found for any visits on 05/24/20.  Assessment & Plan     1. Dysfunction of right eustachian tube Patient has been using Afrin and flonase without relief. No signs of infection noted today. Suspect ETD. Will treat with prednisone taper as below. Call or mychart message if not improving and will refer to ENT.  - predniSONE (STERAPRED UNI-PAK 21 TAB) 10 MG (21) TBPK tablet; 6 day taper; take as directed on package instructions  Dispense: 21 tablet; Refill: 0   No follow-ups on file.      Delmer Islam, PA-C, have reviewed all documentation for this visit. The documentation on 05/24/20 for the exam, diagnosis, procedures, and orders are all accurate and complete.   Reine Just  [facility] [phone] (phone) [phone] (fax)  [facility]

## 2020-05-24 NOTE — Patient Instructions (Signed)

## 2020-05-28 ENCOUNTER — Telehealth: Payer: Self-pay

## 2020-05-28 DIAGNOSIS — H6981 Other specified disorders of Eustachian tube, right ear: Secondary | ICD-10-CM

## 2020-05-28 NOTE — Telephone Encounter (Signed)
Copied from CRM [id number]. Topic: General - Inquiry >> May 28, 2020  9:32 AM Adrian Prince D wrote: Reason for CRM: Patient called for Dr. Rosezetta Schlatter requesting a referral to ENT because he isn't feeling any better. He said they already discussed it at his appointment on Monday. He can be reached at [phone]. Please advise

## 2020-05-28 NOTE — Telephone Encounter (Signed)
Referral placed.

## 2020-06-01 ENCOUNTER — Ambulatory Visit: Payer: 59 | Admitting: Family Medicine

## 2020-06-01 NOTE — Progress Notes (Deleted)
      Established patient visit   Patient: Peter Miranda.   DOB: 1965-12-02   55 y.o. Male  MRN: [id number] Visit Date: 06/01/2020  Today's healthcare provider: Mila Merry, MD   No chief complaint on file.  Subjective    HPI  Peter Miranda. presents for right ear pain today.   {Show patient history (optional):23778::" "}   Medications: Outpatient Medications Prior to Visit  Medication Sig  . fluticasone (FLONASE) 50 MCG/ACT nasal spray Place 1 spray into both nostrils daily.  . predniSONE (STERAPRED UNI-PAK 21 TAB) 10 MG (21) TBPK tablet 6 day taper; take as directed on package instructions  . traZODone (DESYREL) 50 MG tablet Take 0.5-1 tablets (25-50 mg total) by mouth at bedtime as needed for sleep.   No facility-administered medications prior to visit.    Review of Systems  {Labs  Heme  Chem  Endocrine  Serology  Results Review (optional):23779::" "}   Objective    There were no vitals taken for this visit. {Show previous vital signs (optional):23777::" "}   Physical Exam  ***  No results found for any visits on 06/01/20.  Assessment & Plan     ***  No follow-ups on file.      {provider attestation***:1}   Mila Merry, MD  [facility] [phone] (phone) [phone] (fax)  [facility]

## 2020-08-20 ENCOUNTER — Ambulatory Visit (INDEPENDENT_AMBULATORY_CARE_PROVIDER_SITE_OTHER): Payer: 59 | Admitting: Family Medicine

## 2020-08-20 ENCOUNTER — Other Ambulatory Visit: Payer: Self-pay

## 2020-08-20 ENCOUNTER — Encounter: Payer: Self-pay | Admitting: Family Medicine

## 2020-08-20 VITALS — BP 124/86 | HR 68 | Temp 97.9°F | Resp 16 | Ht 70.0 in | Wt 193.0 lb

## 2020-08-20 DIAGNOSIS — Z23 Encounter for immunization: Secondary | ICD-10-CM

## 2020-08-20 DIAGNOSIS — Z8042 Family history of malignant neoplasm of prostate: Secondary | ICD-10-CM

## 2020-08-20 DIAGNOSIS — Z0000 Encounter for general adult medical examination without abnormal findings: Secondary | ICD-10-CM | POA: Diagnosis not present

## 2020-08-20 DIAGNOSIS — R739 Hyperglycemia, unspecified: Secondary | ICD-10-CM

## 2020-08-20 DIAGNOSIS — Z125 Encounter for screening for malignant neoplasm of prostate: Secondary | ICD-10-CM

## 2020-08-20 NOTE — Patient Instructions (Addendum)
. It is recommended to engage in 150 minutes of moderate exercise every week.      Preventive Care 40-54 Years Old, Male Preventive care refers to lifestyle choices and visits with your health care provider that can promote health and wellness. This includes:  A yearly physical exam. This is also called an annual wellness visit.  Regular dental and eye exams.  Immunizations.  Screening for certain conditions.  Healthy lifestyle choices, such as: ? Eating a healthy diet. ? Getting regular exercise. ? Not using drugs or products that contain nicotine and tobacco. ? Limiting alcohol use. What can I expect for my preventive care visit? Physical exam Your health care provider will check your:  Height and weight. These may be used to calculate your BMI (body mass index). BMI is a measurement that tells if you are at a healthy weight.  Heart rate and blood pressure.  Body temperature.  Skin for abnormal spots. Counseling Your health care provider may ask you questions about your:  Past medical problems.  Family's medical history.  Alcohol, tobacco, and drug use.  Emotional well-being.  Home life and relationship well-being.  Sexual activity.  Diet, exercise, and sleep habits.  Work and work environment.  Access to firearms. What immunizations do I need? Vaccines are usually given at various ages, according to a schedule. Your health care provider will recommend vaccines for you based on your age, medical history, and lifestyle or other factors, such as travel or where you work.   What tests do I need? Blood tests  Lipid and cholesterol levels. These may be checked every 5 years, or more often if you are over 50 years old.  Hepatitis C test.  Hepatitis B test. Screening  Lung cancer screening. You may have this screening every year starting at age 55 if you have a 30-pack-year history of smoking and currently smoke or have quit within the past 15  years.  Prostate cancer screening. Recommendations will vary depending on your family history and other risks.  Genital exam to check for testicular cancer or hernias.  Colorectal cancer screening. ? All adults should have this screening starting at age 50 and continuing until age 75. ? Your health care provider may recommend screening at age 45 if you are at increased risk. ? You will have tests every 1-10 years, depending on your results and the type of screening test.  Diabetes screening. ? This is done by checking your blood sugar (glucose) after you have not eaten for a while (fasting). ? You may have this done every 1-3 years.  STD (sexually transmitted disease) testing, if you are at risk. Follow these instructions at home: Eating and drinking  Eat a diet that includes fresh fruits and vegetables, whole grains, lean protein, and low-fat dairy products.  Take vitamin and mineral supplements as recommended by your health care provider.  Do not drink alcohol if your health care provider tells you not to drink.  If you drink alcohol: ? Limit how much you have to 0-2 drinks a day. ? Be aware of how much alcohol is in your drink. In the [place] one drink equals one 12 oz bottle of beer (355 mL), one 5 oz glass of wine (148 mL), or one 1 oz glass of hard liquor (44 mL).   Lifestyle  Take daily care of your teeth and gums. Brush your teeth every morning and night with fluoride toothpaste. Floss one time each day.  Stay active. Exercise for at   least 30 minutes 5 or more days each week.  Do not use any products that contain nicotine or tobacco, such as cigarettes, e-cigarettes, and chewing tobacco. If you need help quitting, ask your health care provider.  Do not use drugs.  If you are sexually active, practice safe sex. Use a condom or other form of protection to prevent STIs (sexually transmitted infections).  If told by your health care provider, take low-dose aspirin daily  starting at age 50.  Find healthy ways to cope with stress, such as: ? Meditation, yoga, or listening to music. ? Journaling. ? Talking to a trusted person. ? Spending time with friends and family. Safety  Always wear your seat belt while driving or riding in a vehicle.  Do not drive: ? If you have been drinking alcohol. Do not ride with someone who has been drinking. ? When you are tired or distracted. ? While texting.  Wear a helmet and other protective equipment during sports activities.  If you have firearms in your house, make sure you follow all gun safety procedures. What's next?  Go to your health care provider once a year for an annual wellness visit.  Ask your health care provider how often you should have your eyes and teeth checked.  Stay up to date on all vaccines. This information is not intended to replace advice given to you by your health care provider. Make sure you discuss any questions you have with your health care provider. Document Revised: 01/07/2019 Document Reviewed: 04/04/2018 Elsevier Patient Education  2021 Elsevier Inc.  

## 2020-08-20 NOTE — Progress Notes (Signed)
Complete physical exam   Patient: Peter Miranda.   DOB: Oct 23, 1965   55 y.o. Male  MRN: [id number] Visit Date: 08/20/2020  Today's healthcare provider: Mila Merry, MD   Chief Complaint  Patient presents with  . Annual Exam  . Insomnia   Subjective    Peter Miranda. is a 55 y.o. male who presents today for a complete physical exam.  He reports consuming a general diet. The patient does not participate in regular exercise at present. He generally feels fairly well. He reports sleeping fairly well. He does not have additional problems to discuss today.  HPI  Follow up for insomnia:  The patient was last seen for this 7 months ago. Changes made at last visit include none; continue Trazodone as needed.  He reports fair compliance with treatment. He feels that condition is Unchanged. Patient states he rarely uses Trazodone and only keeps it on hand as needed. He is not having side effects.   He states he has changed his job and insurance since his last physical. He was found to have mildly elevated glucose on his last labs.   He states he had third covid vaccine dose at [facility] around December or 2021.  -----------------------------------------------------------------------------------------   No past medical history on file. Past Surgical History:  Procedure Laterality Date  . COLONOSCOPY WITH PROPOFOL N/A 09/24/2017   Procedure: COLONOSCOPY WITH PROPOFOL;  Surgeon: Toney Reil, MD;  Location: [facility] END[facility]COPY;  Service: Gastroenterology;  Laterality: N/A;   Social History   Socioeconomic History  . Marital status: Legally Separated    Spouse name: Not on file  . Number of children: 2  . Years of education: Not on file  . Highest education level: Not on file  Occupational History  . Occupation: Agricultural engineer  Tobacco Use  . Smoking status: Never Smoker  . Smokeless tobacco: Never Used  Vaping Use  . Vaping Use: Never used   Substance and Sexual Activity  . Alcohol use: Yes    Alcohol/week: 3.0 standard drinks    Types: 3 Shots of liquor per week    Comment: occasional  . Drug use: No  . Sexual activity: Not on file  Other Topics Concern  . Not on file  Social History Narrative  . Not on file   Social Determinants of Health   Financial Resource Strain: Not on file  Food Insecurity: Not on file  Transportation Needs: Not on file  Physical Activity: Not on file  Stress: Not on file  Social Connections: Not on file  Intimate Partner Violence: Not on file   Family Status  Relation Name Status  . Mother  Alive  . Father  Alive  . PGF  Deceased at age 59'S       PROSTATE CANCER  . Sister  Alive  . Brother  Alive  . MGM  Deceased  . Oneal Grout  (Not Specified)  . Neg Hx  (Not Specified)   Family History  Problem Relation Age of Onset  . Hyperlipidemia Mother   . Hypertension Mother   . Varicose Veins Father   . Prostate cancer Father   . Prostate cancer Paternal Grandfather   . Macular degeneration Maternal Grandmother   . Prostate cancer Paternal Uncle   . Colon cancer Neg Hx    No Known Allergies  Patient Care Team: Malva Limes, MD as PCP - General (Family Medicine) Madlyn Frankel, MD as Referring Physician (Dermatology)  Drumheller, Wayne Both, MD as Referring Physician (Dermatology) Pa,  [facility] (Ophthalmology)   Medications: Outpatient Medications Prior to Visit  Medication Sig  . fluticasone (FLONASE) 50 MCG/ACT nasal spray Place 1 spray into both nostrils daily.  . traZODone (DESYREL) 50 MG tablet Take 0.5-1 tablets (25-50 mg total) by mouth at bedtime as needed for sleep.  . [DISCONTINUED] predniSONE (STERAPRED UNI-PAK 21 TAB) 10 MG (21) TBPK tablet 6 day taper; take as directed on package instructions (Patient not taking: Reported on 08/20/2020)   No facility-administered medications prior to visit.    Review of Systems  Constitutional: Negative for  appetite change, chills, fatigue and fever.  HENT: Positive for ear pain (pain and fullness in right ear x several months s/p ENT work up). Negative for congestion, hearing loss, nosebleeds and trouble swallowing.   Eyes: Negative for pain and visual disturbance.  Respiratory: Negative for cough, chest tightness and shortness of breath.   Cardiovascular: Negative for chest pain, palpitations and leg swelling.  Gastrointestinal: Negative for abdominal pain, blood in stool, constipation, diarrhea, nausea and vomiting.  Endocrine: Negative for polydipsia, polyphagia and polyuria.  Genitourinary: Negative for dysuria and flank pain.  Musculoskeletal: Negative for arthralgias, back pain, joint swelling, myalgias and neck stiffness.  Skin: Negative for color change, rash and wound.  Neurological: Negative for dizziness, tremors, seizures, speech difficulty, weakness, light-headedness and headaches.  Psychiatric/Behavioral: Negative for behavioral problems, confusion, decreased concentration, dysphoric mood and sleep disturbance. The patient is not nervous/anxious.   All other systems reviewed and are negative.     Objective    BP 124/86 (BP Location: Left Arm, Patient Position: Sitting, Cuff Size: Normal)   Pulse 68   Temp 97.9 F (36.6 C) (Temporal)   Resp 16   Ht 5\' 10"  (1.778 m)   Wt 193 lb (87.5 kg)   BMI 27.69 kg/m    Physical Exam   General Appearance:     Well developed, well nourished male. Alert, cooperative, in no acute distress, appears stated age  Head:    Normocephalic, without obvious abnormality, atraumatic  Eyes:    PERRL, conjunctiva/corneas clear, EOM's intact, fundi    benign, both eyes       Ears:    Normal TM's and external ear canals, both ears  Neck:   Supple, symmetrical, trachea midline, no adenopathy;       thyroid:  No enlargement/tenderness/nodules; no carotid   bruit or JVD  Back:     Symmetric, no curvature, ROM normal, no CVA tenderness  Lungs:      Clear to auscultation bilaterally, respirations unlabored  Chest wall:    No tenderness or deformity  Heart:    Normal heart rate. Normal rhythm. No murmurs, rubs, or gallops.  S1 and S2 normal  Abdomen:     Soft, non-tender, bowel sounds active all four quadrants,    no masses, no organomegaly  Genitalia:    deferred  Rectal:    deferred  Extremities:   All extremities are intact. No cyanosis or edema  Pulses:   2+ and symmetric all extremities  Skin:   Skin color, texture, turgor normal, no rashes or lesions  Lymph nodes:   Cervical, supraclavicular, and axillary nodes normal  Neurologic:   CNII-XII intact. Normal strength, sensation and reflexes      throughout     Last depression screening scores PHQ 2/9 Scores 08/20/2020 05/24/2020 01/07/2020  PHQ - 2 Score 0 0 0  PHQ- 9 Score 2 0 0  Last fall risk screening Fall Risk  08/20/2020  Falls in the past year? 0  Number falls in past yr: 0  Injury with Fall? 0  Risk for fall due to : -  Follow up Falls evaluation completed   Last Audit-C alcohol use screening Alcohol Use Disorder Test (AUDIT) 08/20/2020  1. How often do you have a drink containing alcohol? 3  2. How many drinks containing alcohol do you have on a typical day when you are drinking? 0  3. How often do you have six or more drinks on one occasion? 0  AUDIT-C Score 3  4. How often during the last year have you found that you were not able to stop drinking once you had started? 0  5. How often during the last year have you failed to do what was normally expected from you because of drinking? 0  6. How often during the last year have you needed a first drink in the morning to get yourself going after a heavy drinking session? 0  7. How often during the last year have you had a feeling of guilt of remorse after drinking? 0  8. How often during the last year have you been unable to remember what happened the night before because you had been drinking? 0  9. Have you or  someone else been injured as a result of your drinking? 0  10. Has a relative or friend or a doctor or another health worker been concerned about your drinking or suggested you cut down? 0  Alcohol Use Disorder Identification Test Final Score (AUDIT) 3   A score of 3 or more in women, and 4 or more in men indicates increased risk for alcohol abuse, EXCEPT if all of the points are from question 1   No results found for any visits on 08/20/20.  Assessment & Plan    Routine Health Maintenance and Physical Exam  Exercise Activities and Dietary recommendations Goals    . Exercise 150 minutes per week (moderate activity)       Immunization History  Administered Date(s) Administered  . Influenza,inj,Quad PF,6+ Mos 01/07/2020  . PFIZER(Purple Top)SARS-COV-2 Vaccination 07/29/2019, 08/19/2019  . Td 07/10/2017  . Tdap 01/13/2008    Health Maintenance  Topic Date Due  . COVID-19 Vaccine (3 - Pfizer risk 4-dose series) 09/16/2019  . INFLUENZA VACCINE  11/22/2020  . TETANUS/TDAP  07/11/2027  . COLONOSCOPY (Pts 45-84yrs Insurance coverage will need to be confirmed)  09/25/2027  . Hepatitis C Screening  Completed  . HIV Screening  Completed  . HPV VACCINES  Aged Out    Discussed health benefits of physical activity, and encouraged him to engage in regular exercise appropriate for his age and condition.  1. Annual physical exam  - CBC - Comprehensive metabolic panel - Lipid panel  2. Hyperglycemia - Hemoglobin A1c  3. Family history of prostate cancer in father  - PSA  4. Prostate cancer screening  - PSA   Shingrix #1 given today.        The entirety of the information documented in the History of Present Illness, Review of Systems and Physical Exam were personally obtained by me. Portions of this information were initially documented by the CMA and reviewed by me for thoroughness and accuracy.      Mila Merry, MD  [facility] [phone]  (phone) [phone] (fax)  [facility]

## 2020-08-21 LAB — COMPREHENSIVE METABOLIC PANEL
ALT: 35 IU/L (ref 0–44)
AST: 34 IU/L (ref 0–40)
Albumin/Globulin Ratio: 1.8 (ref 1.2–2.2)
Albumin: 4.2 g/dL (ref 3.8–4.9)
Alkaline Phosphatase: 65 IU/L (ref 44–121)
BUN/Creatinine Ratio: 15 (ref 9–20)
BUN: 17 mg/dL (ref 6–24)
Bilirubin Total: 0.9 mg/dL (ref 0.0–1.2)
CO2: 25 mmol/L (ref 20–29)
Calcium: 9 mg/dL (ref 8.7–10.2)
Chloride: 100 mmol/L (ref 96–106)
Creatinine, Ser: 1.14 mg/dL (ref 0.76–1.27)
Globulin, Total: 2.4 g/dL (ref 1.5–4.5)
Glucose: 92 mg/dL (ref 65–99)
Potassium: 4.4 mmol/L (ref 3.5–5.2)
Sodium: 139 mmol/L (ref 134–144)
Total Protein: 6.6 g/dL (ref 6.0–8.5)
eGFR: 76 mL/min/{1.73_m2} (ref >59)

## 2020-08-21 LAB — LIPID PANEL
Chol/HDL Ratio: 2.8 ratio (ref 0.0–5.0)
Cholesterol, Total: 178 mg/dL (ref 100–199)
HDL: 64 mg/dL (ref >39)
LDL Chol Calc (NIH): 104 mg/dL — ABNORMAL HIGH (ref 0–99)
Triglycerides: 51 mg/dL (ref 0–149)
VLDL Cholesterol Cal: 10 mg/dL (ref 5–40)

## 2020-08-21 LAB — CBC
Hematocrit: 46.8 % (ref 37.5–51.0)
Hemoglobin: 15.8 g/dL (ref 13.0–17.7)
MCH: 31.5 pg (ref 26.6–33.0)
MCHC: 33.8 g/dL (ref 31.5–35.7)
MCV: 93 fL (ref 79–97)
Platelets: 206 10*3/uL (ref 150–450)
RBC: 5.02 x10E6/uL (ref 4.14–5.80)
RDW: 13.1 % (ref 11.6–15.4)
WBC: 4.5 10*3/uL (ref 3.4–10.8)

## 2020-08-21 LAB — HEMOGLOBIN A1C
Est. average glucose Bld gHb Est-mCnc: 111 mg/dL
Hgb A1c MFr Bld: 5.5 % (ref 4.8–5.6)

## 2020-08-21 LAB — PSA: Prostate Specific Ag, Serum: 0.8 ng/mL (ref 0.0–4.0)

## 2020-12-01 ENCOUNTER — Other Ambulatory Visit: Payer: Self-pay

## 2020-12-01 ENCOUNTER — Ambulatory Visit (INDEPENDENT_AMBULATORY_CARE_PROVIDER_SITE_OTHER): Payer: 59 | Admitting: Family Medicine

## 2020-12-01 DIAGNOSIS — Z23 Encounter for immunization: Secondary | ICD-10-CM | POA: Diagnosis not present

## 2020-12-01 DIAGNOSIS — G4700 Insomnia, unspecified: Secondary | ICD-10-CM

## 2020-12-01 MED ORDER — TRAZODONE HCL 50 MG PO TABS: 25.0000 mg | ORAL_TABLET | Freq: Every evening | ORAL | 5 refills | Status: DC | PRN

## 2020-12-01 NOTE — Telephone Encounter (Signed)
Patient came into the office today requesting refill. Please advise. He states he only has 3 pills left.

## 2021-03-25 IMAGING — CR DG CHEST 2V
2 series · 2 of 2 positions shown · non-contrast
Comparison: None.

CLINICAL DATA: Chest pain

EXAM:
CHEST - 2 VIEW

[chest pa]
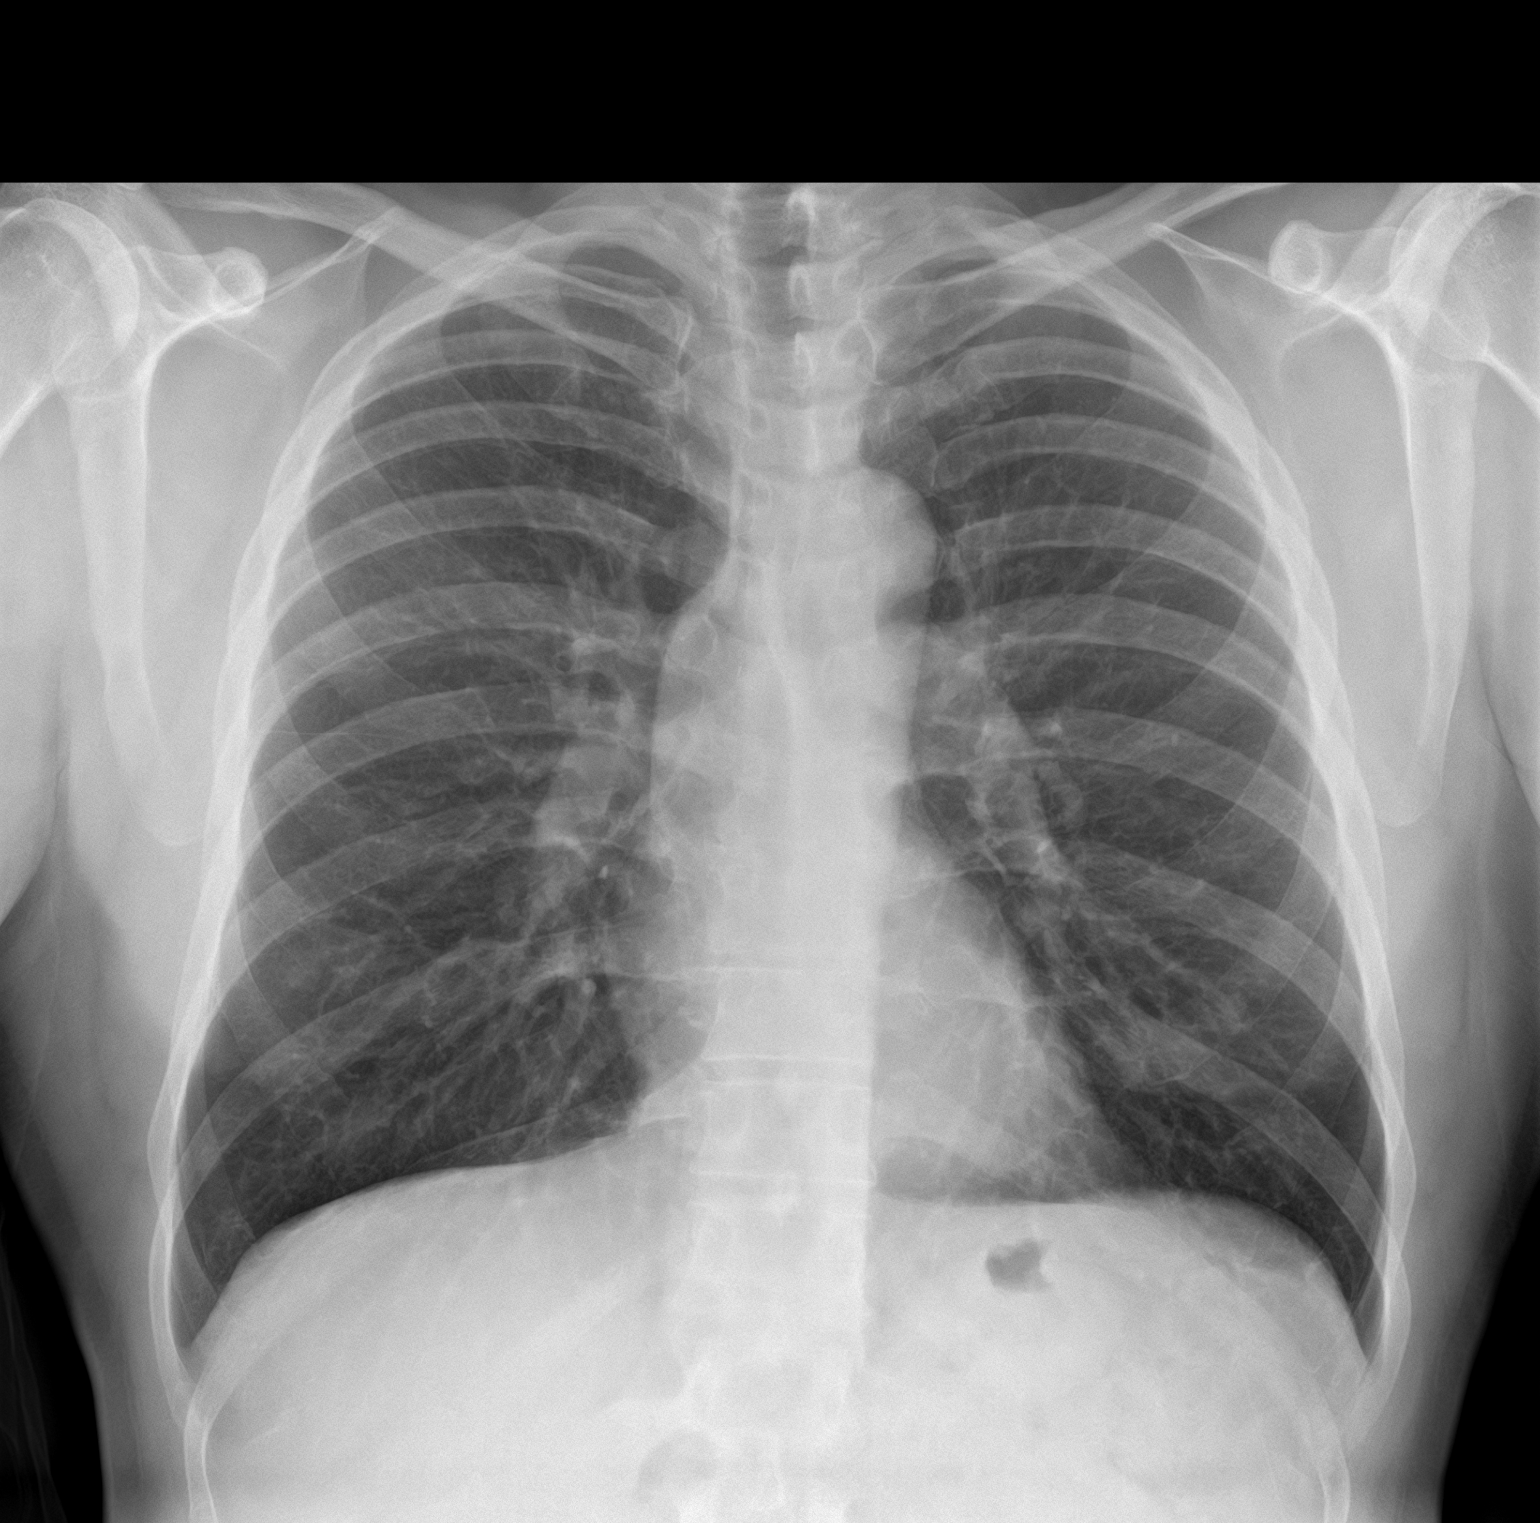

[chest lat]
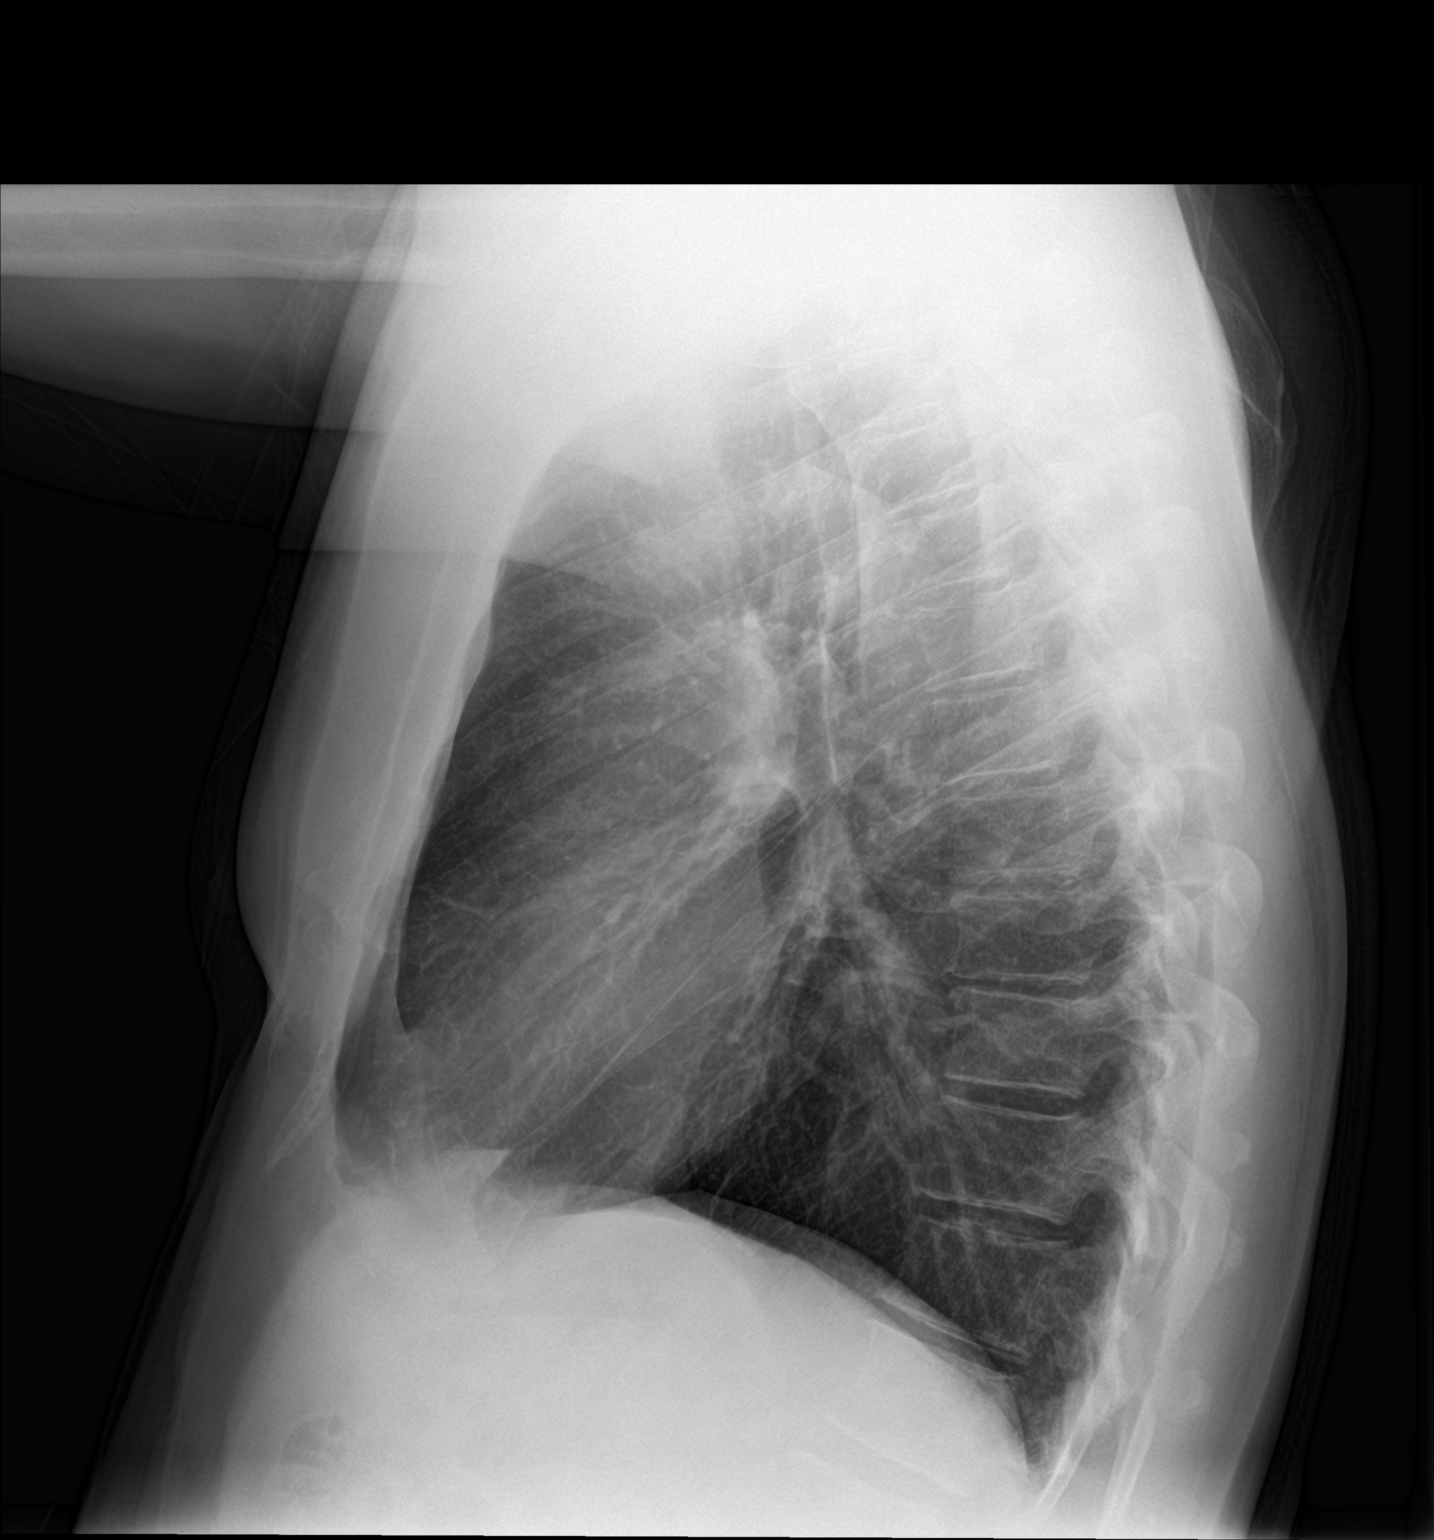

[2 of 2 positions shown; findings below may reference images not displayed]

FINDINGS: The heart size and mediastinal contours are within normal limits.
Both lungs are clear. The visualized skeletal structures are
unremarkable.
IMPRESSION: No acute abnormality of the lungs.

## 2021-08-29 NOTE — Progress Notes (Signed)
?  ? ? ?Established patient visit ? ? ?Patient: Peter Miranda.   DOB: 1965-06-04   56 y.o. Male  MRN: [id number] ?Visit Date: 08/30/2021 ? ?Today's healthcare provider: Mila Merry, MD  ? ?I,Kathleen J Wolford,acting as a Neurosurgeon for Mila Merry, MD.,have documented all relevant documentation on the behalf of Mila Merry, MD,as directed by  Mila Merry, MD while in the presence of Mila Merry, MD.  ?Chief Complaint  ?Patient presents with  ? Eye Problem  ? ?Subjective  ?  ?Eye Problem  ?Both eyes are affected. This is a new problem. The current episode started in the past 7 days. The problem has been unchanged. There was no injury mechanism. There is No known exposure to pink eye. He Does not wear contacts. Associated symptoms include an eye discharge, eye redness, a foreign body sensation, itching and a recent URI. Pertinent negatives include no blurred vision, double vision, fever, nausea, photophobia or vomiting. He has tried eye drops (patient states that he tried Polymycin prescription eye drops) for the symptoms. The treatment provided mild relief.   ? ?He also reports trouble with feeling full and congested in right ear, with decreased hearing in same ear for the last year or so. Has been treated for eustachian tube dysfunction here and at [facility] with fluticasone nasal spray and several courses of prednisone without significant improvement. He is not having any other allergy symptoms and would like referral to another ENT for second opinion.   ? ?Medications: ?Outpatient Medications Prior to Visit  ?Medication Sig  ? traZODone (DESYREL) 50 MG tablet Take 0.5-1 tablets (25-50 mg total) by mouth at bedtime as needed for sleep.  ? [DISCONTINUED] fluticasone (FLONASE) 50 MCG/ACT nasal spray Place 1 spray into both nostrils daily.  ? ?No facility-administered medications prior to visit.  ? ? ?Review of Systems  ?Constitutional:  Negative for fever.  ?Eyes:  Positive for discharge, redness  and itching. Negative for blurred vision, double vision and photophobia.  ?Gastrointestinal:  Negative for nausea and vomiting.  ? ? ?  Objective  ?  ?BP 118/78   Pulse 72   Temp 97.6 ?F (36.4 ?C) (Oral)   Resp 16   Wt 179 lb 9.6 oz (81.5 kg)   SpO2 100%   BMI 25.77 kg/m?  ? ? ?Physical Exam  ? ?Mildly injected conjunctiva both eyes. No current discharge.  ? Assessment & Plan  ?  ? ?1. Acute viral conjunctivitis of both eyes ?Slightly improved since he started using some left over polytrim that was prescribed to his daughter. Counseled that viral conjunctivitis is usually self limiting but the antibiotic eye drops may provide a some relief and may reduce risk of secondary bacterial infections. Can continue as long as sx continue to improve. Prescription for  trimethoprim-polymyxin b (POLYTRIM) ophthalmic solution; Place 2 drops into both eyes every 4 (four) hours.  Dispense: 10 mL; Refill: 0 ? ?2. ETD (Eustachian tube dysfunction), right ? ?3. Hearing difficulty of right ear ?Minimal if any long term improvement when treated with fluticasone nasal spray and prednisone by local ENT.  ?- Ambulatory referral to ENT ([facility] by patient request.  ?   ? ?The entirety of the information documented in the History of Present Illness, Review of Systems and Physical Exam were personally obtained by me. Portions of this information were initially documented by the CMA and reviewed by me for thoroughness and accuracy.   ? ? ?Mila Merry, MD  ?[facility]  Family Practice ?[phone] (phone) ?[phone] (fax) ? ?[facility]  ?

## 2021-08-30 ENCOUNTER — Encounter: Payer: Self-pay | Admitting: Family Medicine

## 2021-08-30 ENCOUNTER — Ambulatory Visit (INDEPENDENT_AMBULATORY_CARE_PROVIDER_SITE_OTHER): Payer: 59 | Admitting: Family Medicine

## 2021-08-30 VITALS — BP 118/78 | HR 72 | Temp 97.6°F | Resp 16 | Wt 179.6 lb

## 2021-08-30 DIAGNOSIS — H9191 Unspecified hearing loss, right ear: Secondary | ICD-10-CM | POA: Diagnosis not present

## 2021-08-30 DIAGNOSIS — B309 Viral conjunctivitis, unspecified: Secondary | ICD-10-CM | POA: Diagnosis not present

## 2021-08-30 DIAGNOSIS — H6981 Other specified disorders of Eustachian tube, right ear: Secondary | ICD-10-CM | POA: Diagnosis not present

## 2021-08-30 MED ORDER — POLYMYXIN B-TRIMETHOPRIM 10000-0.1 UNIT/ML-% OP SOLN: 2.0000 [drp] | OPHTHALMIC | 0 refills | Status: DC

## 2021-11-28 ENCOUNTER — Encounter: Payer: Self-pay | Admitting: Family Medicine

## 2021-11-28 ENCOUNTER — Ambulatory Visit (INDEPENDENT_AMBULATORY_CARE_PROVIDER_SITE_OTHER): Payer: 59 | Admitting: Family Medicine

## 2021-11-28 VITALS — BP 135/87 | HR 78 | Temp 98.3°F | Resp 16 | Ht 70.0 in | Wt 179.3 lb

## 2021-11-28 DIAGNOSIS — H9201 Otalgia, right ear: Secondary | ICD-10-CM

## 2021-11-28 DIAGNOSIS — Z125 Encounter for screening for malignant neoplasm of prostate: Secondary | ICD-10-CM | POA: Diagnosis not present

## 2021-11-28 DIAGNOSIS — Z0000 Encounter for general adult medical examination without abnormal findings: Secondary | ICD-10-CM | POA: Diagnosis not present

## 2021-11-28 DIAGNOSIS — K140 Glossitis: Secondary | ICD-10-CM

## 2021-11-28 DIAGNOSIS — Z131 Encounter for screening for diabetes mellitus: Secondary | ICD-10-CM

## 2021-11-28 NOTE — Progress Notes (Signed)
I,Jana Robinson,acting as a scribe for Lelon Huh, MD.,have documented all relevant documentation on the behalf of Lelon Huh, MD,as directed by  Lelon Huh, MD while in the presence of Lelon Huh, MD.   Complete physical exam   Patient: Peter Miranda.   DOB: 01/11/1966   56 y.o. Male  MRN: [id number] Visit Date: 11/28/2021  Today's healthcare provider: Lelon Huh, MD   Chief Complaint  Patient presents with   Annual Exam   Subjective    Peter Miranda. is a 56 y.o. male who presents today for a complete physical exam.  He reports consuming a general diet. Gym/ health club routine includes cardio and mod to heavy weightlifting. He generally feels well. He reports sleeping well. He does have additional problems to discuss today.  Reports continued fullness in the ear/right.  Has been to ENT in [place] and at [facility] and thought to be secondary to GERD and prescribed esomeprazole which he has just recently started.   He also states he has had some burning on the tip of his tongue off and on, no trouble swallowing or sensation of food or drink getting stuck in throat.   No past medical history on file. Past Surgical History:  Procedure Laterality Date   COLONOSCOPY WITH PROPOFOL N/A 09/24/2017   Procedure: COLONOSCOPY WITH PROPOFOL;  Surgeon: Lin Landsman, MD;  Location: [facility] ENDOSCOPY;  Service: Gastroenterology;  Laterality: N/A;   Social History   Socioeconomic History   Marital status: Legally Separated    Spouse name: Not on file   Number of children: 2   Years of education: Not on file   Highest education level: Not on file  Occupational History   Occupation: Community education officer  Tobacco Use   Smoking status: Never   Smokeless tobacco: Never  Vaping Use   Vaping Use: Never used  Substance and Sexual Activity   Alcohol use: Yes    Alcohol/week: 3.0 standard drinks of alcohol    Types: 3 Shots of liquor per week    Comment:  occasional   Drug use: No   Sexual activity: Not on file  Other Topics Concern   Not on file  Social History Narrative   Not on file   Social Determinants of Health   Financial Resource Strain: Not on file  Food Insecurity: Not on file  Transportation Needs: Not on file  Physical Activity: Not on file  Stress: Not on file  Social Connections: Not on file  Intimate Partner Violence: Not on file   Family Status  Relation Name Status   Mother  Alive   Father  Alive   PGF  Deceased at age 49'S       PROSTATE CANCER   Sister  Alive   Brother  Alive   MGM  Deceased   Psychiatrist  (Not Specified)   Neg Hx  (Not Specified)   Family History  Problem Relation Age of Onset   Hyperlipidemia Mother    Hypertension Mother    Varicose Veins Father    Prostate cancer Father    Prostate cancer Paternal Grandfather    Macular degeneration Maternal Grandmother    Prostate cancer Paternal Uncle    Colon cancer Neg Hx    No Known Allergies  Patient Care Team: Birdie Sons, MD as PCP - General (Family Medicine) Tamsen Meek, MD as Referring Physician (Dermatology) Drumheller, Glade Nurse, MD as Referring Physician (Dermatology) Pa, [facility] (Ophthalmology)  Medications: Outpatient Medications Prior to Visit  Medication Sig   esomeprazole (NEXIUM) 40 MG capsule Take by mouth.   traZODone (DESYREL) 50 MG tablet Take 0.5-1 tablets (25-50 mg total) by mouth at bedtime as needed for sleep.   [DISCONTINUED] trimethoprim-polymyxin b (POLYTRIM) ophthalmic solution Place 2 drops into both eyes every 4 (four) hours.   No facility-administered medications prior to visit.    Review of Systems  All other systems reviewed and are negative.     Objective     BP 135/87 (BP Location: Left Arm, Patient Position: Sitting, Cuff Size: Normal)   Pulse 78   Temp 98.3 F (36.8 C) (Oral)   Resp 16   Ht 5\' 10"  (1.778 m)   Wt 179 lb 4.8 oz (81.3 kg)   SpO2 99%   BMI 25.73  kg/m     Physical Exam    General Appearance:    Well developed, well nourished male. Alert, cooperative, in no acute distress, appears stated age  Head:    Normocephalic, without obvious abnormality, atraumatic  Eyes:    PERRL, conjunctiva/corneas clear, EOM's intact, fundi    benign, both eyes       Ears:    Normal TM's and external ear canals, both ears  Nose:   Nares normal, septum midline, mucosa normal, no drainage   or sinus tenderness  Throat:   Lips, mucosa, and tongue normal; teeth and gums normal  Neck:   Supple, symmetrical, trachea midline, no adenopathy;       thyroid:  No enlargement/tenderness/nodules; no carotid   bruit or JVD  Back:     Symmetric, no curvature, ROM normal, no CVA tenderness  Lungs:     Clear to auscultation bilaterally, respirations unlabored  Chest wall:    No tenderness or deformity  Heart:    Normal heart rate. Normal rhythm. No murmurs, rubs, or gallops.  S1 and S2 normal  Abdomen:     Soft, non-tender, bowel sounds active all four quadrants,    no masses, no organomegaly  Genitalia:    deferred  Rectal:    deferred  Extremities:   All extremities are intact. No cyanosis or edema  Pulses:   2+ and symmetric all extremities  Skin:   Skin color, texture, turgor normal, no rashes or lesions  Lymph nodes:   Cervical, supraclavicular, and axillary nodes normal  Neurologic:   CNII-XII intact. Normal strength, sensation and reflexes      throughout    Last depression screening scores    11/28/2021    3:15 PM 08/30/2021    8:23 AM 08/20/2020    9:17 AM  PHQ 2/9 Scores  PHQ - 2 Score 0 0 0  PHQ- 9 Score 0  2   Last fall risk screening    11/28/2021    3:15 PM  Fall Risk   Falls in the past year? 0  Number falls in past yr: 0  Injury with Fall? 0  Follow up Falls evaluation completed   Last Audit-C alcohol use screening    11/28/2021    3:15 PM  Alcohol Use Disorder Test (AUDIT)  1. How often do you have a drink containing alcohol? 3   2. How many drinks containing alcohol do you have on a typical day when you are drinking? 0  3. How often do you have six or more drinks on one occasion? 0  AUDIT-C Score 3   A score of 3 or more in women, and 4  or more in men indicates increased risk for alcohol abuse, EXCEPT if all of the points are from question 1   No results found for any visits on 11/28/21.  Assessment & Plan    Routine Health Maintenance and Physical Exam  Exercise Activities and Dietary recommendations  Goals      Exercise 150 minutes per week (moderate activity)        Immunization History  Administered Date(s) Administered   Influenza,inj,Quad PF,6+ Mos 01/07/2020   PFIZER(Purple Top)SARS-COV-2 Vaccination 07/29/2019, 08/19/2019   Td 07/10/2017   Tdap 01/13/2008   Zoster Recombinat (Shingrix) 08/20/2020, 12/01/2020    Health Maintenance  Topic Date Due   COVID-19 Vaccine (3 - Pfizer series) 10/14/2019   INFLUENZA VACCINE  11/22/2021   TETANUS/TDAP  07/11/2027   COLONOSCOPY (Pts 45-31yrs Insurance coverage will need to be confirmed)  09/25/2027   Hepatitis C Screening  Completed   HIV Screening  Completed   Zoster Vaccines- Shingrix  Completed   HPV VACCINES  Aged Out    Discussed health benefits of physical activity, and encouraged him to engage in regular exercise appropriate for his age and condition.   - CBC - Comprehensive metabolic panel  2. Prostate cancer screening  - PSA Total (Reflex To Free) ([facility] only)  3. Glossitis  - Vitamin B12  4. Diabetes mellitus screening  - Lipid panel - Hemoglobin A1c  5. Right ear pain Thought by ENT to be secondary to GERD and recently started on PPI. Counseled on other mechanical reflux precautions.      The entirety of the information documented in the History of Present Illness, Review of Systems and Physical Exam were personally obtained by me. Portions of this information were initially documented by the CMA and reviewed by me  for thoroughness and accuracy.     Mila Merry, MD  [facility] [phone] (phone) [phone] (fax)  [facility]

## 2021-11-28 NOTE — Patient Instructions (Addendum)
Please review the attached list of medications and notify my office if there are any errors.   Please go to the lab draw station in [address] on the second floor of [facility]  when you are fasting for 8 hours. Normal hours are 8:00am to 11:30am and 1:00pm to 4:00pm Monday through Friday     

## 2021-12-03 LAB — COMPREHENSIVE METABOLIC PANEL
ALT: 30 IU/L (ref 0–44)
AST: 30 IU/L (ref 0–40)
Albumin/Globulin Ratio: 1.7 (ref 1.2–2.2)
Albumin: 4.2 g/dL (ref 3.8–4.9)
Alkaline Phosphatase: 72 IU/L (ref 44–121)
BUN/Creatinine Ratio: 15 (ref 9–20)
BUN: 17 mg/dL (ref 6–24)
Bilirubin Total: 0.9 mg/dL (ref 0.0–1.2)
CO2: 25 mmol/L (ref 20–29)
Calcium: 9.3 mg/dL (ref 8.7–10.2)
Chloride: 102 mmol/L (ref 96–106)
Creatinine, Ser: 1.17 mg/dL (ref 0.76–1.27)
Globulin, Total: 2.5 g/dL (ref 1.5–4.5)
Glucose: 91 mg/dL (ref 70–99)
Potassium: 4 mmol/L (ref 3.5–5.2)
Sodium: 140 mmol/L (ref 134–144)
Total Protein: 6.7 g/dL (ref 6.0–8.5)
eGFR: 74 mL/min/{1.73_m2} (ref >59)

## 2021-12-03 LAB — CBC
Hematocrit: 47.7 % (ref 37.5–51.0)
Hemoglobin: 16.8 g/dL (ref 13.0–17.7)
MCH: 31.2 pg (ref 26.6–33.0)
MCHC: 35.2 g/dL (ref 31.5–35.7)
MCV: 89 fL (ref 79–97)
Platelets: 220 10*3/uL (ref 150–450)
RBC: 5.39 x10E6/uL (ref 4.14–5.80)
RDW: 12.3 % (ref 11.6–15.4)
WBC: 4.8 10*3/uL (ref 3.4–10.8)

## 2021-12-03 LAB — LIPID PANEL
Chol/HDL Ratio: 3.1 ratio (ref 0.0–5.0)
Cholesterol, Total: 211 mg/dL — ABNORMAL HIGH (ref 100–199)
HDL: 68 mg/dL (ref >39)
LDL Chol Calc (NIH): 134 mg/dL — ABNORMAL HIGH (ref 0–99)
Triglycerides: 48 mg/dL (ref 0–149)
VLDL Cholesterol Cal: 9 mg/dL (ref 5–40)

## 2021-12-03 LAB — HEMOGLOBIN A1C
Est. average glucose Bld gHb Est-mCnc: 103 mg/dL
Hgb A1c MFr Bld: 5.2 % (ref 4.8–5.6)

## 2021-12-03 LAB — VITAMIN B12: Vitamin B-12: 407 pg/mL (ref 232–1245)

## 2021-12-03 LAB — PSA TOTAL (REFLEX TO FREE): Prostate Specific Ag, Serum: 1 ng/mL (ref 0.0–4.0)

## 2021-12-20 ENCOUNTER — Encounter: Payer: Self-pay | Admitting: Family Medicine

## 2022-06-06 ENCOUNTER — Other Ambulatory Visit: Payer: Self-pay | Admitting: Family Medicine

## 2022-06-06 DIAGNOSIS — G4700 Insomnia, unspecified: Secondary | ICD-10-CM

## 2022-06-06 NOTE — Telephone Encounter (Signed)
Medication Refill - Medication: traZODone (DESYREL) 50 MG tablet   Has the patient contacted their pharmacy? No says it says not able to refill (Agent: If no, request that the patient contact the pharmacy for the refill. If patient does not wish to contact the pharmacy document the reason why and proceed with request.) (Agent: If yes, when and what did the pharmacy advise?)patient called in directly  Preferred Pharmacy (with phone number or street name):  [facility] [id number], [place] Phone: [phone]  Fax: [phone]     Has the patient been seen for an appointment in the last year OR does the patient have an upcoming appointment? yes  Agent: Please be advised that RX refills may take up to 3 business days. We ask that you follow-up with your pharmacy.

## 2022-06-07 MED ORDER — TRAZODONE HCL 50 MG PO TABS: 25.0000 mg | ORAL_TABLET | Freq: Every evening | ORAL | 5 refills | Status: AC | PRN

## 2022-06-07 NOTE — Telephone Encounter (Signed)
Requested medication (s) are due for refill today - expired Rx  Requested medication (s) are on the active medication list -yes  Future visit scheduled -no  Last refill: 12/01/20 #30 5RF  Notes to clinic: expired Rx, fails visit protocol- attempted to call patient- left message to call office for appointment  Requested Prescriptions  Pending Prescriptions Disp Refills   traZODone (DESYREL) 50 MG tablet 30 tablet 5    Sig: Take 0.5-1 tablets (25-50 mg total) by mouth at bedtime as needed for sleep.     Psychiatry: Antidepressants - Serotonin Modulator Failed - 06/06/2022  3:43 PM      Failed - Valid encounter within last 6 months    Recent Outpatient Visits           6 months ago Annual physical exam   [facility] Birdie Sons, MD   9 months ago Acute viral conjunctivitis of both eyes   [place], Donald E, MD   1 year ago Need for shingles vaccine   [facility] Birdie Sons, MD   1 year ago Annual physical exam   [facility] Birdie Sons, MD   2 years ago Dysfunction of right eustachian tube   [place], Anderson Malta M, [place]                 Requested Prescriptions  Pending Prescriptions Disp Refills   traZODone (DESYREL) 50 MG tablet 30 tablet 5    Sig: Take 0.5-1 tablets (25-50 mg total) by mouth at bedtime as needed for sleep.     Psychiatry: Antidepressants - Serotonin Modulator Failed - 06/06/2022  3:43 PM      Failed - Valid encounter within last 6 months    Recent Outpatient Visits           6 months ago Annual physical exam   [facility] Birdie Sons, MD   9 months ago Acute viral conjunctivitis of both eyes   [place], Donald E, MD   1 year ago Need for shingles vaccine   [facility] Birdie Sons,  MD   1 year ago Annual physical exam   [facility] Birdie Sons, MD   2 years ago Dysfunction of right eustachian tube   [facility], [place], [place]

## 2022-10-10 ENCOUNTER — Encounter: Payer: Self-pay | Admitting: Family Medicine

## 2022-10-10 ENCOUNTER — Telehealth: Payer: Self-pay

## 2022-10-10 NOTE — Telephone Encounter (Signed)
Copied from CRM [id number]. Topic: Appointment Scheduling - Scheduling Inquiry for Clinic >> Oct 10, 2022  1:18 PM Marlow Baars wrote: Reason for CRM: The patient called in stating his [facility] insurance covers the annual physical every year so he doesn't need it a year and a day out from his previous physical.

## 2022-10-11 ENCOUNTER — Telehealth: Payer: Self-pay

## 2022-10-11 NOTE — Telephone Encounter (Signed)
Copied from CRM [id number]. Topic: Referral - Request for Referral >> Oct 11, 2022 12:11 PM Santiya F wrote: Has patient seen PCP for this complaint? Yes.   *If NO, is insurance requiring patient see PCP for this issue before PCP can refer them? Referral for which specialty: Urology  Preferred provider/office: [facility]  Reason for referral: Pt says he has been seen by [facility] before and would like the referral sent there. Please advise.

## 2022-10-11 NOTE — Telephone Encounter (Signed)
?   What for

## 2022-10-12 NOTE — Telephone Encounter (Signed)
Called patient to advise him of provider's message, no answer.

## 2022-10-12 NOTE — Telephone Encounter (Signed)
What is the medical reason for the referral.

## 2022-10-13 ENCOUNTER — Ambulatory Visit (INDEPENDENT_AMBULATORY_CARE_PROVIDER_SITE_OTHER): Payer: 59 | Admitting: Family Medicine

## 2022-10-13 ENCOUNTER — Encounter: Payer: Self-pay | Admitting: Family Medicine

## 2022-10-13 VITALS — BP 144/89 | HR 76 | Temp 98.2°F | Resp 12 | Wt 176.0 lb

## 2022-10-13 DIAGNOSIS — N529 Male erectile dysfunction, unspecified: Secondary | ICD-10-CM | POA: Diagnosis not present

## 2022-10-13 DIAGNOSIS — Z8042 Family history of malignant neoplasm of prostate: Secondary | ICD-10-CM | POA: Diagnosis not present

## 2022-10-13 MED ORDER — TADALAFIL 10 MG PO TABS: 10.0000 mg | ORAL_TABLET | Freq: Every day | ORAL | 1 refills | Status: DC | PRN

## 2022-10-13 NOTE — Progress Notes (Signed)
   I,Sulibeya S Dimas,acting as a Neurosurgeon for Mila Merry, MD.,have documented all relevant documentation on the behalf of Mila Merry, MD,as directed by  Mila Merry, MD while in the presence of Mila Merry, MD.     Established patient visit   Patient: Peter Miranda.   DOB: September 28, 1965   57 y.o. Male  MRN: [id number] Visit Date: 10/13/2022  Today's healthcare provider: Mila Merry, MD   Chief Complaint  Patient presents with   Erectile Dysfunction   Subjective    HPI  Patient C/O erectile dysfunction. States that attempted to have intercourse 6 nights ago and was unable to get erection, which has never happened before.  Patient is concerned about prostate cancer. He reports several family members have had prostate cancer. Requesting to have PSA checked. Patient denies any painful urination, burning, or blood in his urine.  He states he was followed by Dr. Achilles Dunk years ago for early prostate cancer screening due to his family history, but had no particular urologic problems at the time.   Medications: Outpatient Medications Prior to Visit  Medication Sig   traZODone (DESYREL) 50 MG tablet Take 0.5-1 tablets (25-50 mg total) by mouth at bedtime as needed for sleep.   [DISCONTINUED] esomeprazole (NEXIUM) 40 MG capsule Take by mouth.   No facility-administered medications prior to visit.    Review of Systems  Constitutional:  Negative for chills and fever.  Gastrointestinal:  Negative for abdominal pain, nausea and vomiting.  Genitourinary:  Negative for dysuria and hematuria.       Objective    BP (!) 144/89 (BP Location: Left Arm, Patient Position: Sitting, Cuff Size: Large)   Pulse 76   Temp 98.2 F (36.8 C) (Temporal)   Resp 12   Wt 176 lb (79.8 kg)   SpO2 100%   BMI 25.25 kg/m  BP Readings from Last 3 Encounters:  10/13/22 (!) 144/89  11/28/21 135/87  08/30/21 118/78   Wt Readings from Last 3 Encounters:  10/13/22 176 lb (79.8 kg)  11/28/21 179  lb 4.8 oz (81.3 kg)  08/30/21 179 lb 9.6 oz (81.5 kg)   Physical Exam  General appearance: Well developed, well nourished male, cooperative and in no acute distress Head: Normocephalic, without obvious abnormality, atraumatic Respiratory: Respirations even and unlabored, normal respiratory rate Extremities: All extremities are intact.  Skin: Skin color, texture, turgor normal. No rashes seen  Psych: Appropriate mood and affect. Neurologic: Mental status: Alert, oriented to person, place, and time, thought content appropriate.     Assessment & Plan     1. Family history of prostate cancer in father   2. Erectile dysfunction, unspecified erectile dysfunction type Slight age related decline over the last few years, but episode 6 nights ago of being unable to have intercourse due to erectile failure. Discussed various potential causes of this occurrence.  - TSH - Testosterone,Free and Total - PSA  - tadalafil (CIALIS) 10 MG tablet; Take 1 tablet (10 mg total) by mouth daily as needed for erectile dysfunction.  Dispense: 30 tablet; Refill: 1   Consider urology referral if      The entirety of the information documented in the History of Present Illness, Review of Systems and Physical Exam were personally obtained by me. Portions of this information were initially documented by the CMA and reviewed by me for thoroughness and accuracy.     Mila Merry, MD  [facility] [phone] (phone) [phone] (fax)  [facility]

## 2022-10-16 LAB — PSA: Prostate Specific Ag, Serum: 0.9 ng/mL (ref 0.0–4.0)

## 2022-10-16 LAB — TESTOSTERONE,FREE AND TOTAL
Testosterone, Free: 15.6 pg/mL (ref 7.2–24.0)
Testosterone: 972 ng/dL — ABNORMAL HIGH (ref 264–916)

## 2022-10-16 LAB — TSH: TSH: 1.17 u[IU]/mL (ref 0.450–4.500)

## 2022-10-19 ENCOUNTER — Encounter: Payer: Self-pay | Admitting: Family Medicine

## 2022-10-24 ENCOUNTER — Other Ambulatory Visit: Payer: Self-pay | Admitting: Family Medicine

## 2022-10-24 DIAGNOSIS — R7989 Other specified abnormal findings of blood chemistry: Secondary | ICD-10-CM

## 2022-11-20 ENCOUNTER — Encounter: Payer: 59 | Admitting: Family Medicine

## 2022-12-19 ENCOUNTER — Ambulatory Visit (INDEPENDENT_AMBULATORY_CARE_PROVIDER_SITE_OTHER): Payer: 59 | Admitting: Family Medicine

## 2022-12-19 ENCOUNTER — Encounter: Payer: Self-pay | Admitting: Family Medicine

## 2022-12-19 VITALS — BP 122/88 | HR 77 | Ht 70.0 in | Wt 179.3 lb

## 2022-12-19 DIAGNOSIS — Z8042 Family history of malignant neoplasm of prostate: Secondary | ICD-10-CM

## 2022-12-19 DIAGNOSIS — Z0000 Encounter for general adult medical examination without abnormal findings: Secondary | ICD-10-CM | POA: Diagnosis not present

## 2022-12-19 DIAGNOSIS — E290 Testicular hyperfunction: Secondary | ICD-10-CM | POA: Diagnosis not present

## 2022-12-19 NOTE — Progress Notes (Signed)
Complete physical exam   Patient: Peter Miranda.   DOB: Nov 19, 1965   57 y.o. Male  MRN: [id number] Visit Date: 12/19/2022  Today's healthcare provider: Mila Merry, MD   Chief Complaint  Patient presents with   Annual Exam    Request for referral to urology and would like to speak on that to prostate cancer due to family history    Subjective    Discussed the use of AI scribe software for clinical note transcription with the patient, who gave verbal consent to proceed.  History of Present Illness   The patient, with a strong family history of prostate cancer, presents for a routine physical. He expresses concern about his risk of developing prostate cancer and requests a referral to a urologist for further evaluation. He is interested in the best possible strategies for early detection, given his family history. He was followed by Dr. Achilles Dunk years ago, but he has since moved out of area.   Additionally, the patient reports a persistent numbness in the fourth toe of his right foot, which has been present for a couple of months. The numbness is localized to the top of the toe and does not affect the bottom. There is also a slight numbness in the adjacent toes. The patient denies any pain associated with this numbness. 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  The patient also mentions a persistent feeling of fullness in his ears, which has been evaluated in the past with an MRI that showed no visible damage. He underwent a minor procedure to relieve some pressure, but the fullness did not completely resolve. He has been advised that this may be due to mild hearing loss.  Lastly, the patient was noted to have elevated testosterone levels when checked in June. He denies taking any supplements that could affect his testosterone levels.     Lab Results  Component Value Date   TESTOSTERONE 972 (H) 10/13/2022   Lab Results   Component Value Date   PSA1 0.9 10/13/2022   PSA1 1.0 12/02/2021   PSA1 0.8 08/20/2020      No past medical history on file. Past Surgical History:  Procedure Laterality Date   COLONOSCOPY WITH PROPOFOL N/A 09/24/2017   Procedure: COLONOSCOPY WITH PROPOFOL;  Surgeon: Toney Reil, MD;  Location: [facility] ENDOSCOPY;  Service: Gastroenterology;  Laterality: N/A;   Social History   Socioeconomic History   Marital status: Legally Separated    Spouse name: Not on file   Number of children: 2   Years of education: Not on file   Highest education level: Not on file  Occupational History   Occupation: Agricultural engineer  Tobacco Use   Smoking status: Never   Smokeless tobacco: Never  Vaping Use   Vaping status: Never Used  Substance and Sexual Activity   Alcohol use: Yes    Alcohol/week: 3.0 standard drinks of alcohol    Types: 3 Shots of liquor per week    Comment: occasional   Drug use: No   Sexual activity: Not on file  Other Topics Concern   Not on file  Social History Narrative   Not on file   Social Determinants of Health   Financial Resource Strain: Not on file  Food Insecurity: Not on file  Transportation Needs: Not on file  Physical Activity: Not on file  Stress: Not on file  Social Connections: Not on file  Intimate  Partner Violence: Not on file   Family Status  Relation Name Status   Mother  Alive   Father  Deceased at age 60   Brother  Alive   MGM  Deceased   PGF  Deceased at age 54'S       PROSTATE CANCER   Nutritional therapist  (Not Specified)   Sister  Alive   Neg Hx  (Not Specified)  No partnership data on file   Family History  Problem Relation Age of Onset   Hyperlipidemia Mother    Hypertension Mother    Varicose Veins Father    Prostate cancer Father    Prostate cancer Brother    Macular degeneration Maternal Grandmother    Prostate cancer Paternal Grandfather    Prostate cancer Paternal Uncle    Colon cancer Neg Hx    No Known  Allergies  Patient Care Team: Malva Limes, MD as PCP - General (Family Medicine) Madlyn Frankel, MD as Referring Physician (Dermatology) Drumheller, Wayne Both, MD as Referring Physician (Dermatology) Pa, [facility] (Ophthalmology)   Medications: Outpatient Medications Prior to Visit  Medication Sig   tadalafil (CIALIS) 10 MG tablet Take 1 tablet (10 mg total) by mouth daily as needed for erectile dysfunction.   traZODone (DESYREL) 50 MG tablet Take 0.5-1 tablets (25-50 mg total) by mouth at bedtime as needed for sleep.   No facility-administered medications prior to visit.    Review of Systems    Objective    BP 122/88 (BP Location: Left Arm, Patient Position: Sitting, Cuff Size: Normal)   Pulse 77   Ht 5\' 10"  (1.778 m)   Wt 179 lb 4.8 oz (81.3 kg)   SpO2 99%   BMI 25.73 kg/m    Physical Exam   General Appearance:    Well developed, well nourished male. Alert, cooperative, in no acute distress, appears stated age  Head:    Normocephalic, without obvious abnormality, atraumatic  Eyes:    PERRL, conjunctiva/corneas clear, EOM's intact, fundi    benign, both eyes       Ears:    Normal TM's and external ear canals, both ears  Nose:   Nares normal, septum midline, mucosa normal, no drainage   or sinus tenderness  Throat:   Lips, mucosa, and tongue normal; teeth and gums normal  Neck:   Supple, symmetrical, trachea midline, no adenopathy;       thyroid:  No enlargement/tenderness/nodules; no carotid   bruit or JVD  Back:     Symmetric, no curvature, ROM normal, no CVA tenderness  Lungs:     Clear to auscultation bilaterally, respirations unlabored  Chest wall:    No tenderness or deformity  Heart:    Normal heart rate. Normal rhythm. No murmurs, rubs, or gallops.  S1 and S2 normal  Abdomen:     Soft, non-tender, bowel sounds active all four quadrants,    no masses, no organomegaly  Genitalia:    deferred  Rectal:    deferred  Extremities:   All extremities  are intact. No cyanosis or edema  Pulses:   2+ and symmetric all extremities  Skin:   Skin color, texture, turgor normal, no rashes or lesions  Lymph nodes:   Cervical, supraclavicular, and axillary nodes normal  Neurologic:   CNII-XII intact. Normal strength, sensation and reflexes      throughout     Last depression screening scores    10/13/2022    8:51 AM 11/28/2021  3:15 PM 08/30/2021    8:23 AM  PHQ 2/9 Scores  PHQ - 2 Score 0 0 0  PHQ- 9 Score 1 0    Last fall risk screening    10/13/2022    8:51 AM  Fall Risk   Falls in the past year? 0  Number falls in past yr: 0  Risk for fall due to : No Fall Risks  Follow up Falls evaluation completed   Last Audit-C alcohol use screening    10/13/2022    8:51 AM  Alcohol Use Disorder Test (AUDIT)  1. How often do you have a drink containing alcohol? 3  2. How many drinks containing alcohol do you have on a typical day when you are drinking? 0  3. How often do you have six or more drinks on one occasion? 0  AUDIT-C Score 3   A score of 3 or more in women, and 4 or more in men indicates increased risk for alcohol abuse, EXCEPT if all of the points are from question 1   No results found for any visits on 12/19/22.  Assessment & Plan    Routine Health Maintenance and Physical Exam  Exercise Activities and Dietary recommendations  Goals      Exercise 150 minutes per week (moderate activity)        Immunization History  Administered Date(s) Administered   Influenza,inj,Quad PF,6+ Mos 01/07/2020   PFIZER(Purple Top)SARS-COV-2 Vaccination 07/29/2019, 08/19/2019   Td 07/10/2017   Tdap 01/13/2008   Zoster Recombinant(Shingrix) 08/20/2020, 12/01/2020    Health Maintenance  Topic Date Due   COVID-19 Vaccine (3 - 2023-24 season) 12/23/2021   INFLUENZA VACCINE  11/23/2022   DTaP/Tdap/Td (3 - Td or Tdap) 07/11/2027   Colonoscopy  09/25/2027   Hepatitis C Screening  Completed   HIV Screening  Completed   Zoster Vaccines-  Shingrix  Completed   HPV VACCINES  Aged Out    Discussed health benefits of physical activity, and encouraged him to engage in regular exercise appropriate for his age and condition.      Annual complete physical exam.  -Order routine lab screening labs including A1C for work-related health premium discount. -Continue regular physical activity.   Family History of Prostate Cancer Strong family history of prostate cancer. Patient is interested in early detection and prevention strategies. -Up to date on PSA screening. Asymptomatic -Refer to urology for further evaluation and management.  Numbness in Right Toe Numbness in the right fourth toe for a couple of months. No pain or functional impairment. Likely due to irritation of the digital nerve. -Observe for now. -Call If symptoms persist or worsen  Elevated Testosterone Previous lab results showed high testosterone levels. Patient does not take any supplements that could affect testosterone levels. -Repeat testosterone level on current lab order.           Mila Merry, MD  [facility] [phone] (phone) [phone] (fax)  [facility]

## 2022-12-20 LAB — LIPID PANEL
Chol/HDL Ratio: 2.8 ratio (ref 0.0–5.0)
Cholesterol, Total: 173 mg/dL (ref 100–199)
HDL: 62 mg/dL (ref >39)
LDL Chol Calc (NIH): 97 mg/dL (ref 0–99)
Triglycerides: 73 mg/dL (ref 0–149)
VLDL Cholesterol Cal: 14 mg/dL (ref 5–40)

## 2022-12-20 LAB — COMPREHENSIVE METABOLIC PANEL
ALT: 19 IU/L (ref 0–44)
AST: 26 IU/L (ref 0–40)
Albumin: 4.4 g/dL (ref 3.8–4.9)
Alkaline Phosphatase: 67 IU/L (ref 44–121)
BUN/Creatinine Ratio: 13 (ref 9–20)
BUN: 15 mg/dL (ref 6–24)
Bilirubin Total: 1 mg/dL (ref 0.0–1.2)
CO2: 24 mmol/L (ref 20–29)
Calcium: 9.3 mg/dL (ref 8.7–10.2)
Chloride: 103 mmol/L (ref 96–106)
Creatinine, Ser: 1.14 mg/dL (ref 0.76–1.27)
Globulin, Total: 2.2 g/dL (ref 1.5–4.5)
Glucose: 93 mg/dL (ref 70–99)
Potassium: 4.3 mmol/L (ref 3.5–5.2)
Sodium: 140 mmol/L (ref 134–144)
Total Protein: 6.6 g/dL (ref 6.0–8.5)
eGFR: 75 mL/min/{1.73_m2} (ref >59)

## 2022-12-20 LAB — CBC
Hematocrit: 49.3 % (ref 37.5–51.0)
Hemoglobin: 17 g/dL (ref 13.0–17.7)
MCH: 30.5 pg (ref 26.6–33.0)
MCHC: 34.5 g/dL (ref 31.5–35.7)
MCV: 88 fL (ref 79–97)
Platelets: 214 10*3/uL (ref 150–450)
RBC: 5.58 x10E6/uL (ref 4.14–5.80)
RDW: 12.4 % (ref 11.6–15.4)
WBC: 4.8 10*3/uL (ref 3.4–10.8)

## 2022-12-20 LAB — TESTOSTERONE: Testosterone: 583 ng/dL (ref 264–916)

## 2022-12-20 LAB — HEMOGLOBIN A1C
Est. average glucose Bld gHb Est-mCnc: 111 mg/dL
Hgb A1c MFr Bld: 5.5 % (ref 4.8–5.6)

## 2023-01-17 ENCOUNTER — Ambulatory Visit: Payer: 59 | Admitting: Urology

## 2023-02-20 ENCOUNTER — Ambulatory Visit: Payer: 59 | Admitting: Urology

## 2023-02-20 ENCOUNTER — Encounter: Payer: Self-pay | Admitting: Urology

## 2023-02-20 VITALS — BP 136/84 | HR 85 | Ht 70.0 in | Wt 175.0 lb

## 2023-02-20 DIAGNOSIS — Z8042 Family history of malignant neoplasm of prostate: Secondary | ICD-10-CM

## 2023-02-20 DIAGNOSIS — Z125 Encounter for screening for malignant neoplasm of prostate: Secondary | ICD-10-CM

## 2023-02-20 NOTE — Progress Notes (Signed)
I,Amy L Pierron,acting as a scribe for Vanna Scotland, MD.,have documented all relevant documentation on the behalf of Vanna Scotland, MD,as directed by  Vanna Scotland, MD while in the presence of Vanna Scotland, MD.  02/20/2023 12:39 PM   Peter Miranda. [birth year] [id number]  Referring provider: Malva Limes, MD [address] [place],  [place] [address]  Chief Complaint  Patient presents with   Establish Care   family history of prostate cancer    Would like to discuss    HPI: 57 year-old male was referred for discussion of family history of prostate cancer.  His most recent PSA from 10/13/2022 was 0.9 which is stable at this value for at least 5 years.  His grandfather had prostate cancer and died in his early 16's. His father had his prostate removed around age 11 and lived till age 32. His brother had prostate cancer in his early 59's and thinks he had the seeds. He knows a few first cousins have had it too.  He does not believe there is any history of breast or ovarian cancer.  Today he has minimal urinary symptoms.  He is struggling with occasional mild erectile dysfunction for which he has been prescribed tadalafil by his primary care physician.  He is very health-conscious, proactive about his diet and exercise.  When he gains weight, he eats a low-carb diet which sometimes does involve higher fat containing foods.   Surgical History: Past Surgical History:  Procedure Laterality Date   COLONOSCOPY WITH PROPOFOL N/A 09/24/2017   Procedure: COLONOSCOPY WITH PROPOFOL;  Surgeon: Toney Reil, MD;  Location: [facility] ENDOSCOPY;  Service: Gastroenterology;  Laterality: N/A;    Home Medications:  Allergies as of 02/20/2023   No Known Allergies      Medication List        Accurate as of February 20, 2023 12:39 PM. If you have any questions, ask your nurse or doctor.          tadalafil 10 MG tablet Commonly known as: CIALIS Take 1  tablet (10 mg total) by mouth daily as needed for erectile dysfunction.   traZODone 50 MG tablet Commonly known as: DESYREL Take 0.5-1 tablets (25-50 mg total) by mouth at bedtime as needed for sleep.        Family History: Family History  Problem Relation Age of Onset   Hyperlipidemia Mother    Hypertension Mother    Varicose Veins Father    Prostate cancer Father    Prostate cancer Brother    Macular degeneration Maternal Grandmother    Prostate cancer Paternal Grandfather    Prostate cancer Paternal Uncle    Colon cancer Neg Hx     Social History:  reports that he has never smoked. He has never used smokeless tobacco. He reports current alcohol use of about 3.0 standard drinks of alcohol per week. He reports that he does not use drugs.   Physical Exam: BP 136/84   Pulse 85   Ht 5\' 10"  (1.778 m)   Wt 175 lb (79.4 kg)   BMI 25.11 kg/m   Constitutional:  Alert and oriented, No acute distress. HEENT: [place] AT, moist mucus membranes.  Trachea midline, no masses. GU: 50 gram prostate, mildly enlarged, symmetric, no nodules, prominent lateral ridges, left greater than right. Neurologic: Grossly intact, no focal deficits, moving all 4 extremities. Psychiatric: Normal mood and affect.   Assessment & Plan:    1. Family history of prostate cancer/prostate cancer  screening  - Lengthy discussion today regarding the difference between enlarged prostate and prostate cancer. You can have one and not the other. Explained a variety of urinary symptoms may present with an enlarged prostate and treatment of those is based on the specific symptom.  - Explained genetic testing can help determine his genetic profile, and potentially implications for screening for his offspring as well.  Given that he does not have a personal history of prostate cancer, it is unclear whether or not he may be a carrier of the genes related to prostate cancer.  Unlikely to change his ultimate outcome if he is  being screened appropriately but may have implications for his children.  Will send a referral to a genetic counselor for further evaluation.   - Encouraged a healthy diet and lifestyle with exercise, being mindful of fat intake.   - Recommend yearly prostate screening here with PSA check and DRE per with the American Urologic Guidelines.   - Brief explanation of how erectile dysfunction can progress with aging and what to expect.   Return in about 1 year (around 02/20/2024) for PSA, DRE.  I have reviewed the above documentation for accuracy and completeness, and I agree with the above.   Vanna Scotland, MD    [facility] [address] [place], [place] [address] [phone]

## 2023-03-12 NOTE — Progress Notes (Unsigned)
REFERRING PROVIDER: Vanna Scotland, MD [address] [place],  [place] [address]  PRIMARY PROVIDER:  Malva Limes, MD  PRIMARY REASON FOR VISIT:  1. Family history of prostate cancer      HISTORY OF PRESENT ILLNESS:   Peter Miranda, a 57 y.o. male, was seen for a [place] cancer genetics consultation at the request of Dr. Apolinar Junes due to a family history of prostate cancer.  Peter Miranda presents to clinic today to discuss the possibility of a hereditary predisposition to cancer, genetic testing, and to further clarify his future cancer risks, as well as potential cancer risks for family members.   CANCER HISTORY:  Peter Miranda is a 57 y.o. male with no personal history of cancer.  He had colonoscopy in 2019 that was normal. PSA up to date, normal.  Past Surgical History:  Procedure Laterality Date   COLONOSCOPY WITH PROPOFOL N/A 09/24/2017   Procedure: COLONOSCOPY WITH PROPOFOL;  Surgeon: Toney Reil, MD;  Location: [facility] ENDOSCOPY;  Service: Gastroenterology;  Laterality: N/A;    FAMILY HISTORY:  We obtained a detailed, 4-generation family history.  Significant diagnoses are listed below: Family History  Problem Relation Age of Onset   Hyperlipidemia Mother    Hypertension Mother    Varicose Veins Father    Prostate cancer Father        dx 78s, d. 86   Prostate cancer Brother        dx early 79s   Macular degeneration Maternal Grandmother    Prostate cancer Paternal Grandfather 42       metastatic   Prostate cancer Cousin        dx>50   Colon cancer Neg Hx    Peter Miranda has 1 son, 27, 1 daughter, 46. He has 1 brother and 1 sister. His brother had prostate cancer in his early 36s and is living at 1. He has not had genetic testing.  Peter Miranda father had prostate cancer in his 5s and passed at 28. His cancer was treated with surgery. Paternal first cousin had prostate cancer over age 41, and there are possibly other cousins/aunts/uncles on this  side that had cancer. Paternal grandfather had metastatic prostate cancer and passed at 21. Paternal grandmother passed in her 53s.  Peter Miranda mother is living at 58. No known cancers on this side of the family.  [name] is unaware of previous family history of genetic testing for hereditary cancer risks. There is no reported Ashkenazi Jewish ancestry. There is no known consanguinity.    GENETIC COUNSELING ASSESSMENT: Peter Miranda is a 57 y.o. male with a family history of prostate cancer which is somewhat suggestive of a hereditary cancer syndrome and predisposition to cancer. We, therefore, discussed and recommended the following at today's visit.   DISCUSSION: We discussed that approximately 10% of prostate cancer is hereditary. Most cases of hereditary prostate cancer are associated with BRCA1/BRCA2 genes, although there are other genes associated with hereditary prostate cancer as well including HOXB13 and others. Cancers and risks are gene specific. We discussed that testing is beneficial for several reasons including knowing about cancer risks, identifying potential screening and risk-reduction options that may be appropriate, and to understand if other family members could be at risk for cancer and allow them to undergo genetic testing.   We reviewed the characteristics, features and inheritance patterns of hereditary cancer syndromes. We also discussed genetic testing, including the appropriate family members to test, the process of testing, insurance  coverage and turn-around-time for results. We discussed the implications of a negative, positive and/or variant of uncertain significant result. We recommended Peter Miranda pursue genetic testing for the [facility] CancerNext-Expanded+RNA gene panel.   Based on Peter Miranda family history of cancer, he meets medical criteria for genetic testing. Despite that he meets criteria, he may still have an out of pocket cost.  PLAN: After considering the  risks, benefits, and limitations, Peter Miranda provided informed consent to pursue genetic testing and the blood sample was sent to [facility] for analysis of the CancerNext-Expanded+RNA panel. Results should be available within approximately 2-3 weeks' time, at which point they will be disclosed by telephone to Peter Miranda, as will any additional recommendations warranted by these results. Peter Miranda will receive a summary of his genetic counseling visit and a copy of his results once available. This information will also be available in Epic.   Peter Miranda questions were answered to his satisfaction today. Our contact information was provided should additional questions or concerns arise. Thank you for the referral and allowing [place] to share in the care of your patient.   Lacy Duverney, MS, [facility] Genetic Counselor [email or web address] Phone: [phone]  The patient was seen for a total of 25 minutes in face-to-face genetic counseling.  Dr. Blake Divine was available for discussion regarding this case.   _______________________________________________________________________ For Office Staff:  Number of people involved in session: 1 Was an Intern/ student involved with case: no

## 2023-03-13 ENCOUNTER — Encounter: Payer: Self-pay | Admitting: Licensed Clinical Social Worker

## 2023-03-13 ENCOUNTER — Inpatient Hospital Stay: Payer: 59

## 2023-03-13 ENCOUNTER — Inpatient Hospital Stay: Payer: 59 | Attending: Oncology | Admitting: Licensed Clinical Social Worker

## 2023-03-13 DIAGNOSIS — Z8042 Family history of malignant neoplasm of prostate: Secondary | ICD-10-CM

## 2023-03-27 ENCOUNTER — Encounter: Payer: Self-pay | Admitting: Licensed Clinical Social Worker

## 2023-03-27 ENCOUNTER — Telehealth: Payer: Self-pay | Admitting: Licensed Clinical Social Worker

## 2023-03-27 ENCOUNTER — Ambulatory Visit: Payer: Self-pay | Admitting: Licensed Clinical Social Worker

## 2023-03-27 DIAGNOSIS — Z1379 Encounter for other screening for genetic and chromosomal anomalies: Secondary | ICD-10-CM

## 2023-03-27 NOTE — Telephone Encounter (Signed)
I contacted Mr. Repetti to discuss his genetic testing results. No pathogenic variants were identified in the 76 genes analyzed. Detailed clinic note to follow.   The test report has been scanned into EPIC and is located under the Molecular Pathology section of the Results Review tab.  A portion of the result report is included below for reference.      Lacy Duverney, MS, [facility] Genetic Counselor [email or web address] Phone: [phone]

## 2023-03-27 NOTE — Progress Notes (Signed)
HPI:   Mr. Winkeler was previously seen in the [facility] due to a family history of prostate cancer and concerns regarding a hereditary predisposition to cancer. Please refer to our prior cancer genetics clinic note for more information regarding our discussion, assessment and recommendations, at the time. Mr. Antczak recent genetic test results were disclosed to him, as were recommendations warranted by these results. These results and recommendations are discussed in more detail below.  CANCER HISTORY:  Oncology History   No history exists.    FAMILY HISTORY:  We obtained a detailed, 4-generation family history.  Significant diagnoses are listed below: Family History  Problem Relation Age of Onset   Hyperlipidemia Mother    Hypertension Mother    Varicose Veins Father    Prostate cancer Father        dx 79s, d. 16   Prostate cancer Brother        dx early 40s   Macular degeneration Maternal Grandmother    Prostate cancer Paternal Grandfather 25       metastatic   Prostate cancer Cousin        dx>50   Colon cancer Neg Hx    Mr. Langton has 1 son, 49, 1 daughter, 16. He has 1 brother and 1 sister. His brother had prostate cancer in his early 44s and is living at 44. He has not had genetic testing.   Mr. Omori father had prostate cancer in his 23s and passed at 35. His cancer was treated with surgery. Paternal first cousin had prostate cancer over age 17, and there are possibly other cousins/aunts/uncles on this side that had cancer. Paternal grandfather had metastatic prostate cancer and passed at 25. Paternal grandmother passed in her 39s.   Mr. Cleere mother is living at 36. No known cancers on this side of the family.   Mr. Apo is unaware of previous family history of genetic testing for hereditary cancer risks. There is no reported Ashkenazi Jewish ancestry. There is no known consanguinity.      GENETIC TEST RESULTS:  The [facility]  CancerNext-Expanded+RNA Panel found no pathogenic mutations.   The CancerNext-Expanded gene panel offered by [employer] and includes sequencing, rearrangement, and RNA analysis for the following 76 genes: AIP, ALK, APC, ATM, AXIN2, BAP1, BARD1, BMPR1A, BRCA1, BRCA2, BRIP1, CDC73, CDH1, CDK4, CDKN1B, CDKN2A, CEBPA, CHEK2, CTNNA1, DDX41, DICER1, ETV6, FH, FLCN, GATA2, LZTR1, MAX, MBD4, MEN1, MET, MLH1, MSH2, MSH3, MSH6, MUTYH, NF1, NF2, NTHL1, PALB2, PHOX2B, PMS2, POT1, PRKAR1A, PTCH1, PTEN, RAD51C, RAD51D, RB1, RET, RUNX1, SDHA, SDHAF2, SDHB, SDHC, SDHD, SMAD4, SMARCA4, SMARCB1, SMARCE1, STK11, SUFU, TMEM127, TP53, TSC1, TSC2, VHL, and WT1 (sequencing and deletion/duplication); EGFR, HOXB13, KIT, MITF, PDGFRA, POLD1, and POLE (sequencing only); EPCAM and GREM1 (deletion/duplication only).   The test report has been scanned into EPIC and is located under the Molecular Pathology section of the Results Review tab.  A portion of the result report is included below for reference. Genetic testing reported out on 03/26/2023.     Even though a pathogenic variant was not identified, possible explanations for the cancer in the family may include: There may be no hereditary risk for cancer in the family. The cancers in Mr. Sater and/or his family may be sporadic/familial or due to other genetic and environmental factors. There may be a gene mutation in one of these genes that current testing methods cannot detect but that chance is small. There could be another gene that has not yet been discovered,  or that we have not yet tested, that is responsible for the cancer diagnoses in the family.  It is also possible there is a hereditary cause for the cancer in the family that Mr. Westly did not inherit Therefore, it is important to remain in touch with cancer genetics in the future so that we can continue to offer Mr. Horbal the most up to date genetic testing.   ADDITIONAL GENETIC TESTING:  We discussed with Mr.  Mo that his genetic testing was fairly extensive.  If there are additional relevant genes identified to increase cancer risk that can be analyzed in the future, we would be happy to discuss and coordinate this testing at that time.    CANCER SCREENING RECOMMENDATIONS:  Mr. Finkle test result is considered negative (normal).  This means that we have not identified a hereditary cause for his family history of cancer at this time.   An individual's cancer risk and medical management are not determined by genetic test results alone. Overall cancer risk assessment incorporates additional factors, including personal medical history, family history, and any available genetic information that may result in a personalized plan for cancer prevention and surveillance. Therefore, it is recommended he continue to follow the cancer management and screening guidelines provided by his primary healthcare provider. RECOMMENDATIONS FOR FAMILY MEMBERS:   Since he did not inherit a identifiable mutation in a cancer predisposition gene included on this panel, his children could not have inherited a known mutation from him in one of these genes. Individuals in this family might be at some increased risk of developing cancer, over the general population risk, due to the family history of cancer.  Individuals in the family should notify their providers of the family history of cancer. We recommend women in this family have a yearly mammogram beginning at age 56, or 48 years younger than the earliest onset of cancer, an annual clinical breast exam, and perform monthly breast self-exams.  Family members should have colonoscopies by at age 60, or earlier, as recommended by their providers. Other members of the family may still carry a pathogenic variant in one of these genes that Mr. Mica did not inherit. Based on the family history, we recommend his brother who had prostate cancer have genetic counseling and testing. Mr.  Friedel will let us know if we can be of any assistance in coordinating genetic counseling and/or testing for this family member.     FOLLOW-UP:  Lastly, we discussed with Mr. Hensler that cancer genetics is a rapidly advancing field and it is possible that new genetic tests will be appropriate for him and/or his family members in the future. We encouraged him to remain in contact with cancer genetics on an annual basis so we can update his personal and family histories and let him know of advances in cancer genetics that may benefit this family.   Our contact number was provided. Mr. Lister questions were answered to his satisfaction, and he knows he is welcome to call us at anytime with additional questions or concerns.    Lacy Duverney, MS, [facility] Genetic Counselor [email or web address] Phone: [phone]

## 2023-08-06 ENCOUNTER — Other Ambulatory Visit: Payer: Self-pay | Admitting: Family Medicine

## 2023-08-06 DIAGNOSIS — N529 Male erectile dysfunction, unspecified: Secondary | ICD-10-CM

## 2023-12-11 ENCOUNTER — Encounter: Payer: Self-pay | Admitting: Urology

## 2023-12-26 ENCOUNTER — Encounter: Payer: Self-pay | Admitting: Family Medicine

## 2023-12-26 ENCOUNTER — Ambulatory Visit (INDEPENDENT_AMBULATORY_CARE_PROVIDER_SITE_OTHER): Admitting: Family Medicine

## 2023-12-26 VITALS — BP 129/93 | HR 81 | Ht 70.0 in | Wt 183.9 lb

## 2023-12-26 DIAGNOSIS — Z8042 Family history of malignant neoplasm of prostate: Secondary | ICD-10-CM | POA: Diagnosis not present

## 2023-12-26 DIAGNOSIS — Z23 Encounter for immunization: Secondary | ICD-10-CM | POA: Diagnosis not present

## 2023-12-26 DIAGNOSIS — Z125 Encounter for screening for malignant neoplasm of prostate: Secondary | ICD-10-CM

## 2023-12-26 DIAGNOSIS — Z0000 Encounter for general adult medical examination without abnormal findings: Secondary | ICD-10-CM

## 2023-12-26 NOTE — Progress Notes (Signed)
 Complete physical exam   Patient: Peter Miranda.   DOB: 11-14-1965   58 y.o. Male  MRN: [id number] Visit Date: 12/26/2023  Today's healthcare provider: Nancyann Perry, MD   Chief Complaint  Patient presents with   Annual Exam    Last completed 12/19/22 Diet -  General, well balanced but generally more healthy Exercise - twice a week for 45 minutes to 1 hour Feeling - well Sleeping - well Concerns - family hx of prostate cancer, should he be checked more than once a year, believes he has a varicose vein and if he should have it looked at    Immunizations    Influenza - wants to receive later (preferably when getting updated covid) Pneumococcal - ok to order Hepatitis B - ok to order   Subjective    Discussed the use of AI scribe software for clinical note transcription with the patient, who gave verbal consent to proceed.  History of Present Illness   Peter Miranda. Kayla is a 58 year old male who presents for a routine annual physical exam.  He is interested in monitoring his prostate health due to a significant family history of prostate cancer, including his grandfather, father, brother, and first cousins. He has no symptoms such as pelvic pain or increased frequency of urination, although he notes urinating more slowly with age.  He mentions the recent appearance of a varicose vein, which is new and different from the other side.  He drinks alcohol a couple of times a week, more frequently during football season, but it is not a daily habit. He also experiences acid reflux and uses a standing desk to help manage symptoms after meals.      HPI     Annual Exam    Additional comments: Last completed 12/19/22 Diet -  General, well balanced but generally more healthy Exercise - twice a week for 45 minutes to 1 hour Feeling - well Sleeping - well Concerns - family hx of prostate cancer, should he be checked more than once a year, believes he has a  varicose vein and if he should have it looked at         Immunizations    Additional comments: Influenza - wants to receive later (preferably when getting updated covid) Pneumococcal - ok to order Hepatitis B - ok to order      Last edited by Lilian Fitzpatrick, CMA on 12/26/2023  9:55 AM.       Past Medical History:  Diagnosis Date   Allergy childhood   pet dander   GERD (gastroesophageal reflux disease) Apr 2021   how to manage?   Hypertension young adult   rises if no exercise   Past Surgical History:  Procedure Laterality Date   COLONOSCOPY WITH PROPOFOL  N/A 09/24/2017   Procedure: COLONOSCOPY WITH PROPOFOL ;  Surgeon: Unk Corinn Skiff, MD;  Location[facility] ENDOSCOPY;  Service: Gastroenterology;  Laterality: N/A;   Social History   Socioeconomic History   Marital status: Legally Separated    Spouse name: Not on file   Number of children: 2   Years of education: Not on file   Highest education level: Not on file  Occupational History   Occupation: Agricultural engineer  Tobacco Use   Smoking status: Never   Smokeless tobacco: Never  Vaping Use   Vaping status: Never Used  Substance and Sexual Activity   Alcohol use: Yes    Alcohol/week: 3.0 standard drinks of alcohol  Types: 3 Shots of liquor per week    Comment: occasional   Drug use: No   Sexual activity: Not on file  Other Topics Concern   Not on file  Social History Narrative   Not on file   Social Drivers of Health   Financial Resource Strain: Not on file  Food Insecurity: Not on file  Transportation Needs: Not on file  Physical Activity: Not on file  Stress: Not on file  Social Connections: Not on file  Intimate Partner Violence: Not on file   Family Status  Relation Name Status   Mother Cy Alive   Father Shreyansh Deceased at age 42   Sister  Alive   Brother  Deceased   Mat Aunt  Deceased   Mat Uncle  Alive   Nutritional therapist  (Not Specified)   MGM  Deceased   MGF  Deceased   PGM  Deceased    PGF  Deceased at age 57'S       PROSTATE CANCER   Cousin pat 1st Alive   Daughter Brooke Alive   Brother David Alive   Neg Hx  (Not Specified)  No partnership data on file   Family History  Problem Relation Age of Onset   Hyperlipidemia Mother    Hypertension Mother    Varicose Veins Father    Prostate cancer Father        dx 32s, d. 67   Cancer Father    Prostate cancer Brother        dx early 52s   Macular degeneration Maternal Grandmother    Prostate cancer Paternal Grandfather 53       metastatic   Prostate cancer Cousin        dx>50   ADD / ADHD Daughter    Cancer Brother    Colon cancer Neg Hx    No Known Allergies  Patient Care Team: Gasper Nancyann BRAVO, MD as PCP - General (Family Medicine) Bluford Dorn Holts, MD as Referring Physician (Dermatology) Drumheller, Greig RAMAN, MD as Referring Physician (Dermatology) Pa, [facility] (Ophthalmology)   Medications: Outpatient Medications Prior to Visit  Medication Sig   tadalafil  (CIALIS ) 10 MG tablet TAKE 1 TABLET BY MOUTH DAILY AS NEEDED FOR ERECTILE DYSFUNCTION   traZODone  (DESYREL ) 50 MG tablet Take 0.5-1 tablets (25-50 mg total) by mouth at bedtime as needed for sleep.   No facility-administered medications prior to visit.    Review of Systems  Constitutional:  Negative for appetite change, chills and fever.  Respiratory:  Negative for chest tightness, shortness of breath and wheezing.   Cardiovascular:  Negative for chest pain and palpitations.  Gastrointestinal:  Negative for abdominal pain, nausea and vomiting.      Objective    BP (!) 129/93   Pulse 81   Ht 5' 10 (1.778 m)   Wt 183 lb 14.4 oz (83.4 kg)   BMI 26.39 kg/m    Physical Exam  General Appearance:    Well developed, well nourished male. Alert, cooperative, in no acute distress, appears stated age  Head:    Normocephalic, without obvious abnormality, atraumatic  Eyes:    PERRL, conjunctiva/corneas clear, EOM's intact, fundi     benign, both eyes       Ears:    Normal TM's and external ear canals, both ears  Nose:   Nares normal, septum midline, mucosa normal, no drainage   or sinus tenderness  Throat:   Lips, mucosa, and tongue normal; teeth and  gums normal  Neck:   Supple, symmetrical, trachea midline, no adenopathy;       thyroid:  No enlargement/tenderness/nodules; no carotid   bruit or JVD  Back:     Symmetric, no curvature, ROM normal, no CVA tenderness  Lungs:     Clear to auscultation bilaterally, respirations unlabored  Chest wall:    No tenderness or deformity  Heart:    Normal heart rate. Normal rhythm. No murmurs, rubs, or gallops.  S1 and S2 normal  Abdomen:     Soft, non-tender, bowel sounds active all four quadrants,    no masses, no organomegaly  Genitalia:    deferred  Rectal:    deferred  Extremities:   All extremities are intact. No cyanosis or edema  Pulses:   2+ and symmetric all extremities  Skin:   Skin color, texture, turgor normal, no rashes or lesions  Lymph nodes:   Cervical, supraclavicular, and axillary nodes normal  Neurologic:   CNII-XII intact. Normal strength, sensation and reflexes      throughout       Last depression screening scores    10/13/2022    8:51 AM 11/28/2021    3:15 PM 08/30/2021    8:23 AM  PHQ 2/9 Scores  PHQ - 2 Score 0 0 0  PHQ- 9 Score 1 0    Last fall risk screening    10/13/2022    8:51 AM  Fall Risk   Falls in the past year? 0  Number falls in past yr: 0  Risk for fall due to : No Fall Risks  Follow up Falls evaluation completed   Last Audit-C alcohol use screening    10/13/2022    8:51 AM  Alcohol Use Disorder Test (AUDIT)  1. How often do you have a drink containing alcohol? 3  2. How many drinks containing alcohol do you have on a typical day when you are drinking? 0  3. How often do you have six or more drinks on one occasion? 0  AUDIT-C Score 3   A score of 3 or more in women, and 4 or more in men indicates increased risk for alcohol  abuse, EXCEPT if all of the points are from question 1   No results found for any visits on 12/26/23.  Assessment & Plan    Routine Health Maintenance and Physical Exam  Exercise Activities and Dietary recommendations  Goals      Exercise 150 minutes per week (moderate activity)        Immunization History  Administered Date(s) Administered   Hepb-cpg 12/26/2023   Influenza,inj,Quad PF,6+ Mos 01/07/2020   PFIZER(Purple Top)SARS-COV-2 Vaccination 07/29/2019, 08/19/2019   PNEUMOCOCCAL CONJUGATE-20 12/26/2023   Td 07/10/2017   Tdap 01/13/2008   Zoster Recombinant(Shingrix) 08/20/2020, 12/01/2020    Health Maintenance  Topic Date Due   COVID-19 Vaccine (3 - 2025-26 season) 12/24/2023   INFLUENZA VACCINE  07/22/2024 (Originally 11/23/2023)   Hepatitis B Vaccines 19-59 Average Risk (2 of 2 - CpG 2-dose series) 01/23/2024   DTaP/Tdap/Td (3 - Td or Tdap) 07/11/2027   Colonoscopy  09/25/2027   Pneumococcal Vaccine: 50+ Years  Completed   Hepatitis C Screening  Completed   HIV Screening  Completed   Zoster Vaccines- Shingrix  Completed   HPV VACCINES  Aged Out   Meningococcal B Vaccine  Aged Out    Discussed health benefits of physical activity, and encouraged him to engage in regular exercise appropriate for his age and condition.       -  CBC - Comprehensive metabolic panel with GFR - Lipid panel - Hemoglobin A1c  2. Family history of prostate cancer in father   3. Prostate cancer screening   - PSA Total (Reflex To Free)  Consider increasing frequency of PSA screening due to strong family history of prostate cancer.   4. Need for hepatitis vaccination  - Heplisav-B  (HepB-CPG) Vaccine #1 of 2  5. Need for vaccination against Streptococcus pneumoniae  - Pneumococcal conjugate vaccine 20-valent (PCV20)      Nancyann Perry, MD  [facility] [phone] (phone) [phone] (fax)  [facility]

## 2023-12-27 ENCOUNTER — Ambulatory Visit: Payer: Self-pay | Admitting: Family Medicine

## 2023-12-27 DIAGNOSIS — Z8042 Family history of malignant neoplasm of prostate: Secondary | ICD-10-CM

## 2023-12-27 DIAGNOSIS — Z125 Encounter for screening for malignant neoplasm of prostate: Secondary | ICD-10-CM

## 2023-12-27 LAB — COMPREHENSIVE METABOLIC PANEL WITH GFR
ALT: 22 IU/L (ref 0–44)
AST: 24 IU/L (ref 0–40)
Albumin: 4.4 g/dL (ref 3.8–4.9)
Alkaline Phosphatase: 66 IU/L (ref 44–121)
BUN/Creatinine Ratio: 16 (ref 9–20)
BUN: 18 mg/dL (ref 6–24)
Bilirubin Total: 0.6 mg/dL (ref 0.0–1.2)
CO2: 24 mmol/L (ref 20–29)
Calcium: 9.2 mg/dL (ref 8.7–10.2)
Chloride: 102 mmol/L (ref 96–106)
Creatinine, Ser: 1.1 mg/dL (ref 0.76–1.27)
Globulin, Total: 2.1 g/dL (ref 1.5–4.5)
Glucose: 101 mg/dL — ABNORMAL HIGH (ref 70–99)
Potassium: 4.3 mmol/L (ref 3.5–5.2)
Sodium: 140 mmol/L (ref 134–144)
Total Protein: 6.5 g/dL (ref 6.0–8.5)
eGFR: 78 mL/min/1.73 (ref >59)

## 2023-12-27 LAB — LIPID PANEL
Chol/HDL Ratio: 3.5 ratio (ref 0.0–5.0)
Cholesterol, Total: 202 mg/dL — ABNORMAL HIGH (ref 100–199)
HDL: 57 mg/dL (ref >39)
LDL Chol Calc (NIH): 128 mg/dL — ABNORMAL HIGH (ref 0–99)
Triglycerides: 95 mg/dL (ref 0–149)
VLDL Cholesterol Cal: 17 mg/dL (ref 5–40)

## 2023-12-27 LAB — CBC
Hematocrit: 50.6 % (ref 37.5–51.0)
Hemoglobin: 16.7 g/dL (ref 13.0–17.7)
MCH: 31.5 pg (ref 26.6–33.0)
MCHC: 33 g/dL (ref 31.5–35.7)
MCV: 96 fL (ref 79–97)
Platelets: 209 x10E3/uL (ref 150–450)
RBC: 5.3 x10E6/uL (ref 4.14–5.80)
RDW: 13.3 % (ref 11.6–15.4)
WBC: 4.6 x10E3/uL (ref 3.4–10.8)

## 2023-12-27 LAB — HEMOGLOBIN A1C
Est. average glucose Bld gHb Est-mCnc: 108 mg/dL
Hgb A1c MFr Bld: 5.4 % (ref 4.8–5.6)

## 2023-12-27 LAB — PSA TOTAL (REFLEX TO FREE): Prostate Specific Ag, Serum: 1.1 ng/mL (ref 0.0–4.0)

## 2024-01-07 ENCOUNTER — Other Ambulatory Visit: Payer: Self-pay

## 2024-01-07 DIAGNOSIS — Z125 Encounter for screening for malignant neoplasm of prostate: Secondary | ICD-10-CM

## 2024-02-06 DIAGNOSIS — Z125 Encounter for screening for malignant neoplasm of prostate: Secondary | ICD-10-CM | POA: Insufficient documentation

## 2024-02-06 NOTE — Progress Notes (Signed)
   02/11/2024 9:55 AM   Peter Miranda. 09-26-1965 [id number]  Reason for visit: Follow up PSA screening   HPI: 58 y.o. male, initial follow up with me today, previously seen by Dr. Penne in Oct 2024 Very pleasant gentleman Had several questions regarding PSA screening in the context of his family history No interval GU complaints Underwent genetics testing-negative  Prior HPI: Strong family history of prostate cancer  -Father, diagnosed age 58, lived till 41 -Brother diagnosed age 58s, possible brachytherapy  -Grandfather, passed away in early 13s  History of ED  - On tadalafil , via PCP    Physical Exam: BP 136/86   Pulse (!) 101   Ht 5' 10 (1.778 m)   Wt 177 lb (80.3 kg)   BMI 25.40 kg/m    Constitutional:  Alert and oriented, No acute distress.  Laboratory Data:         Component Ref Range & Units (hover) 1 mo ago 1 yr ago 2 yr ago 3 yr ago 4 yr ago 6 yr ago  Prostate Specific Ag, Serum 1.1 0.9 CM 1.0 CM 0.8 CM 1.0 CM 1.0 CM    Pertinent Imaging: N/A    Assessment & Plan:    Encounter for screening prostate specific antigen (PSA) measurement Assessment & Plan: Strong family history of prostate cancer Negative genetics panel Low stable PSAs   - Last PSA 1.1 (Sept 2025)   Prostate cancer screening with PSA is recommended for men age 71-69 through shared decision-making, incorporating patient values, risk factors (family history, African American race, germline mutations), and life expectancy. Screening is generally not recommended in men with <10 years longevity or significant comorbidity. Early-life (age 58-55) baseline screening may help guide future screening intervals in all men, and is recommended in high-risk cohorts.    I had a thoughtful conversation with him today.  He has a significant family history of prostate cancer although reassuring PSA data over the last several years.  While reassuring, he is at still higher risk considering his  early and aggressive familial disease.  I would be fine with continued q6-12 mo testing during this timeframe.  He was more aligned with earlier testing intervals.   - PSA in 6 mo, will communicated via chart. RTC in 12 mo with PSA for check up        Penne JONELLE Skye, MD  [facility] [address], [address] [place], [place] [address] [phone]

## 2024-02-06 NOTE — Assessment & Plan Note (Addendum)
 Strong family history of prostate cancer Negative genetics panel Low stable PSAs   - Last PSA 1.1 (Sept 2025)   Prostate cancer screening with PSA is recommended for men age 57-69 through shared decision-making, incorporating patient values, risk factors (family history, African American race, germline mutations), and life expectancy. Screening is generally not recommended in men with <10 years longevity or significant comorbidity. Early-life (age 79-55) baseline screening may help guide future screening intervals in all men, and is recommended in high-risk cohorts.    I had a thoughtful conversation with him today.  He has a significant family history of prostate cancer although reassuring PSA data over the last several years.  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  I would be fine with continued q6-12 mo testing during this timeframe.  He was more aligned with earlier testing intervals.   - PSA in 6 mo, will communicated via chart. RTC in 12 mo with PSA for check up

## 2024-02-08 ENCOUNTER — Other Ambulatory Visit: Payer: Self-pay

## 2024-02-08 DIAGNOSIS — Z125 Encounter for screening for malignant neoplasm of prostate: Secondary | ICD-10-CM

## 2024-02-09 LAB — PSA: Prostate Specific Ag, Serum: 0.9 ng/mL (ref 0.0–4.0)

## 2024-02-11 ENCOUNTER — Ambulatory Visit (INDEPENDENT_AMBULATORY_CARE_PROVIDER_SITE_OTHER): Admitting: Urology

## 2024-02-11 VITALS — BP 136/86 | HR 101 | Ht 70.0 in | Wt 177.0 lb

## 2024-02-11 DIAGNOSIS — Z125 Encounter for screening for malignant neoplasm of prostate: Secondary | ICD-10-CM | POA: Diagnosis not present

## 2024-02-11 NOTE — Addendum Note (Signed)
 Addended by: Kathren Scearce E on: 02/11/2024 09:58 AM   Modules accepted: Orders

## 2024-02-12 ENCOUNTER — Ambulatory Visit: Payer: Self-pay | Admitting: Urology

## 2024-08-05 ENCOUNTER — Encounter: Admitting: Family Medicine

## 2024-08-11 ENCOUNTER — Other Ambulatory Visit

## 2025-02-12 ENCOUNTER — Ambulatory Visit: Admitting: Urology
